# Patient Record
Sex: Male | Born: 1950
Health system: Southern US, Community
[De-identification: ages and names within clinical notes are randomized; demographics above are authoritative.]

## PROBLEM LIST (undated history)

## (undated) DIAGNOSIS — J449 Chronic obstructive pulmonary disease, unspecified: Secondary | ICD-10-CM

## (undated) DIAGNOSIS — K219 Gastro-esophageal reflux disease without esophagitis: Secondary | ICD-10-CM

## (undated) DIAGNOSIS — N200 Calculus of kidney: Secondary | ICD-10-CM

## (undated) HISTORY — DX: Chronic obstructive pulmonary disease, unspecified: J44.9

## (undated) HISTORY — DX: Gastro-esophageal reflux disease without esophagitis: K21.9

## (undated) HISTORY — PX: HAND SURGERY: SHX662

## (undated) HISTORY — DX: Calculus of kidney: N20.0

---

## 1995-09-10 HISTORY — PX: HAND SURGERY: SHX662

## 2007-06-19 ENCOUNTER — Emergency Department: Payer: Self-pay | Admitting: Emergency Medicine

## 2007-07-01 ENCOUNTER — Ambulatory Visit: Payer: Self-pay | Admitting: Urology

## 2015-03-20 ENCOUNTER — Ambulatory Visit: Payer: Self-pay | Admitting: Primary Care

## 2015-09-05 ENCOUNTER — Ambulatory Visit (INDEPENDENT_AMBULATORY_CARE_PROVIDER_SITE_OTHER): Payer: Medicare HMO | Admitting: Family Medicine

## 2015-09-05 ENCOUNTER — Encounter: Payer: Self-pay | Admitting: Family Medicine

## 2015-09-05 ENCOUNTER — Encounter (INDEPENDENT_AMBULATORY_CARE_PROVIDER_SITE_OTHER): Payer: Self-pay

## 2015-09-05 VITALS — BP 122/70 | HR 50 | Temp 97.8°F | Ht 70.0 in | Wt 163.5 lb

## 2015-09-05 DIAGNOSIS — R21 Rash and other nonspecific skin eruption: Secondary | ICD-10-CM

## 2015-09-05 DIAGNOSIS — J3489 Other specified disorders of nose and nasal sinuses: Secondary | ICD-10-CM

## 2015-09-05 DIAGNOSIS — M79646 Pain in unspecified finger(s): Secondary | ICD-10-CM

## 2015-09-05 DIAGNOSIS — M25569 Pain in unspecified knee: Secondary | ICD-10-CM | POA: Diagnosis not present

## 2015-09-05 NOTE — Progress Notes (Signed)
Pre visit review using our clinic review tool, if applicable. No additional management support is needed unless otherwise documented below in the visit note.  New patient.  He has the record request form and is working on that.  I'll await old records.    Prev with neg HIV testing per patient report.    Recently seen by mult docs with itchy rash on the R flank.  Was told it could be rhus vs eczema vs shingles.  No open lesions now.  Not spreading.  Still itchy  Asking about options.  Not painful.   H/o a lot of waterskiing over the years.  H/o back back in the R lower back for ~2 years, can happen in the AM "if I don't get out of bed the right way."  Then better as the day goes on.  Not a new sx, is chronically intermittent.  No weakness.    He has occ fungal changes in the R foot 4th web space, treated with otc med with transient relief.  Tends not to come back if treated about 1x/week.    Sinus pressure and "cracking and popping" noted, likely from ETD.  No fevers.  No pain o/w.  No fevers.  No ST.  Ongoing.    R medial knee pain.  No locking but can get puffy. Not red.  See above re: activity level, ie waterskiing.    He hyperextended his L 3rd and 4th fingers a few weeks ago, still with some soreness but no locking or lack of ROM.  No bruising or redness.    PMH and SH reviewed  ROS: See HPI, otherwise noncontributory.  Meds, vitals, and allergies reviewed.   nad ncat Tm wnl Nasal exam stuffy and congested.   OP wnl Neck supple, no LA rrr ctab abd soft Back not ttp in midline R>L lower back with patch of chronically irritated skin w/o ulceration.  Ext w/o edema R medial knee ttp on the joint line with crepitus on ROM, no bruising.  acl mcl lcl feel solid.   Varicose veins noted on the B feet, reassured.  No fungal changes noted on the feet.  L 3rd and 4th fingers with normal ROM and no edema/redness but does have some mild tenderness on the extensor tendon on the proximal L  3rd and 4th.

## 2015-09-05 NOTE — Patient Instructions (Signed)
Try icing your left hand and your right knee.  Try a soft knee sleeve (OTC).  That may help some.  Try claritin 10mg  a day for the itching on your back and the runny nose.  If the nasal symptoms are not better, then add on flonase 2 sprays per nostril per day.  Update me as needed.  I'll await the records.  Take care.  Glad to see you.  Schedule a physical for the spring of 2017.

## 2015-09-07 ENCOUNTER — Encounter: Payer: Self-pay | Admitting: Family Medicine

## 2015-09-07 DIAGNOSIS — J3489 Other specified disorders of nose and nasal sinuses: Secondary | ICD-10-CM | POA: Insufficient documentation

## 2015-09-07 DIAGNOSIS — R21 Rash and other nonspecific skin eruption: Secondary | ICD-10-CM | POA: Insufficient documentation

## 2015-09-07 DIAGNOSIS — M79646 Pain in unspecified finger(s): Secondary | ICD-10-CM | POA: Insufficient documentation

## 2015-09-07 DIAGNOSIS — M25569 Pain in unspecified knee: Secondary | ICD-10-CM | POA: Insufficient documentation

## 2015-09-07 NOTE — Assessment & Plan Note (Signed)
Try claritin and then flonase if needed, d/w pt.  He agrees.  Not likely to need abx at this point.  D/w pt. Okay for outpatient f/u. >30 minutes spent in face to face time with patient, >50% spent in counselling or coordination of care

## 2015-09-07 NOTE — Assessment & Plan Note (Signed)
Likely OA, d/w pt.  Try a knee sleeve and icing and update me as needed.  He agrees.

## 2015-09-07 NOTE — Assessment & Plan Note (Signed)
Could be eczema, given B sx.  Would use claritin for the itching and update me if not better.  If he can control the itching, then it may resolve on its own.  He agrees.

## 2015-09-07 NOTE — Assessment & Plan Note (Signed)
Should resolve, no sign of tendon failure.  fx not likely.  Try icing and f/u prn.  He agrees.

## 2015-11-05 ENCOUNTER — Other Ambulatory Visit: Payer: Self-pay | Admitting: Family Medicine

## 2015-11-05 DIAGNOSIS — Z131 Encounter for screening for diabetes mellitus: Secondary | ICD-10-CM

## 2015-11-05 DIAGNOSIS — Z13220 Encounter for screening for lipoid disorders: Secondary | ICD-10-CM

## 2015-11-09 ENCOUNTER — Other Ambulatory Visit: Payer: Medicare HMO

## 2015-11-14 ENCOUNTER — Encounter: Payer: Self-pay | Admitting: Family Medicine

## 2015-11-14 ENCOUNTER — Ambulatory Visit (INDEPENDENT_AMBULATORY_CARE_PROVIDER_SITE_OTHER): Payer: Medicare HMO | Admitting: Family Medicine

## 2015-11-14 ENCOUNTER — Other Ambulatory Visit: Payer: Self-pay | Admitting: Family Medicine

## 2015-11-14 VITALS — BP 110/68 | HR 48 | Temp 97.7°F | Ht 70.0 in | Wt 165.5 lb

## 2015-11-14 DIAGNOSIS — Z119 Encounter for screening for infectious and parasitic diseases, unspecified: Secondary | ICD-10-CM

## 2015-11-14 DIAGNOSIS — Z0000 Encounter for general adult medical examination without abnormal findings: Secondary | ICD-10-CM

## 2015-11-14 DIAGNOSIS — Z125 Encounter for screening for malignant neoplasm of prostate: Secondary | ICD-10-CM

## 2015-11-14 DIAGNOSIS — Z1211 Encounter for screening for malignant neoplasm of colon: Secondary | ICD-10-CM

## 2015-11-14 DIAGNOSIS — Z13220 Encounter for screening for lipoid disorders: Secondary | ICD-10-CM | POA: Diagnosis not present

## 2015-11-14 DIAGNOSIS — Z131 Encounter for screening for diabetes mellitus: Secondary | ICD-10-CM

## 2015-11-14 LAB — LIPID PANEL
CHOL/HDL RATIO: 4
Cholesterol: 193 mg/dL (ref 0–200)
HDL: 49.2 mg/dL (ref >39.00)
LDL CALC: 125 mg/dL — AB (ref 0–99)
NONHDL: 143.44
TRIGLYCERIDES: 90 mg/dL (ref 0.0–149.0)
VLDL: 18 mg/dL (ref 0.0–40.0)

## 2015-11-14 LAB — GLUCOSE, RANDOM: Glucose, Bld: 94 mg/dL (ref 70–99)

## 2015-11-14 LAB — PSA, MEDICARE: PSA: 0.58 ng/mL (ref 0.10–4.00)

## 2015-11-14 MED ORDER — TRIAMCINOLONE ACETONIDE 0.1 % EX CREA: 1.0000 "application " | TOPICAL_CREAM | Freq: Two times a day (BID) | CUTANEOUS | Status: DC | PRN

## 2015-11-14 MED ORDER — DOXYCYCLINE HYCLATE 100 MG PO TABS: 100.0000 mg | ORAL_TABLET | Freq: Two times a day (BID) | ORAL | Status: DC

## 2015-11-14 NOTE — Patient Instructions (Addendum)
Go to the lab on the way out.  We'll contact you with your lab report (sugar, cholesterol, prostate test, hepatitis C test, stool cards).  Check with your insurance to see if they will cover the shingles and tetanus shots. If may be cheaper at the pharmacy.   I would get a flu shot each fall.   We can do your pneumonia shot later on.   Start the antibiotics today and use the cream on the dry chapped skin.  Take care.  Glad to see you.

## 2015-11-14 NOTE — Progress Notes (Signed)
Pre visit review using our clinic review tool, if applicable. No additional management support is needed unless otherwise documented below in the visit note.  I have personally reviewed the Medicare Annual Wellness questionnaire and have noted 1. The patient's medical and social history 2. Their use of alcohol, tobacco or illicit drugs 3. Their current medications and supplements 4. The patient's functional ability including ADL's, fall risks, home safety risks and hearing or visual             impairment. 5. Diet and physical activities 6. Evidence for depression or mood disorders  The patients weight, height, BMI have been recorded in the chart and visual acuity is per eye clinic.  I have made referrals, counseling and provided education to the patient based review of the above and I have provided the pt with a written personalized care plan for preventive services.  Provider list updated- see scanned forms.  Routine anticipatory guidance given to patient.  See health maintenance.  Flu encouraged for fall 2017 Shingles d/w pt  PNA deferred for now, as patient is going to be on abx.  See below  Tetanus d/w pt.  D/w patient ZO:XWRUEAVre:options for colon cancer screening, including IFOB vs. colonoscopy.  Risks and benefits of both were discussed and patient voiced understanding.  Pt elects WUJ:WJXBfor:IFOB.  Prostate cancer screening pending. D/w pt.  Advance directive- son Marcial Pacasimothy designated if patient were incapacitated.   Cognitive function addressed- see scanned forms- and if abnormal then additional documentation follows.  Pt opts in for HCV screening.  D/w pt re: routine screening.    Incidentally noted some LUTS with urgency.  Some slowing of stream and nocturia.  Not bothersome to the point of wanting treatment.   PMH and SH reviewed  Meds, vitals, and allergies reviewed.   ROS: See HPI.  Otherwise negative.    GEN: nad, alert and oriented HEENT: mucous membranes moist NECK: supple w/o  LA CV: rrr. PULM: ctab, no inc wob ABD: soft, +bs EXT: no edema SKIN: no acute rash but dry cracked skin on the B hands with irritated and superficially infected skin on the R 2nd finger, near the PIP.  No fluctuance.

## 2015-11-15 LAB — HEPATITIS C ANTIBODY: HCV Ab: NEGATIVE

## 2015-11-16 DIAGNOSIS — Z0000 Encounter for general adult medical examination without abnormal findings: Secondary | ICD-10-CM | POA: Insufficient documentation

## 2015-11-16 NOTE — Assessment & Plan Note (Signed)
Flu encouraged for fall 2017  Shingles d/w pt  PNA deferred for now, as patient is going to be on abx. See below  Tetanus d/w pt.  D/w patient WU:JWJXBJYre:options for colon cancer screening, including IFOB vs. colonoscopy. Risks and benefits of both were discussed and patient voiced understanding. Pt elects NWG:NFAOfor:IFOB.  Prostate cancer screening pending. D/w pt.  Advance directive- son Marcial Pacasimothy designated if patient were incapacitated.  Cognitive function addressed- see scanned forms- and if abnormal then additional documentation follows.  Pt opts in for HCV screening. D/w pt re: routine screening.  EKG w/o acute changes.  sinus bradycardia. I increased the gain on the EKG. He doesn't have Q wave in V1. Other than sinus brady, wnl. Likely from high fitness level. D/w pt.  No sx. No w/u at this point. Start doxy given the skin changes on the R 2nd finger and use TAC cream on the dry skin.  Still okay for outpatient f/u. Still with normal ROM on the R2nd digit- the issue he has appears to be superficial.  Can update me as needed.

## 2015-11-30 ENCOUNTER — Other Ambulatory Visit (INDEPENDENT_AMBULATORY_CARE_PROVIDER_SITE_OTHER): Payer: Medicare HMO

## 2015-11-30 DIAGNOSIS — Z1211 Encounter for screening for malignant neoplasm of colon: Secondary | ICD-10-CM | POA: Diagnosis not present

## 2015-11-30 LAB — FECAL OCCULT BLOOD, IMMUNOCHEMICAL: FECAL OCCULT BLD: NEGATIVE

## 2015-12-01 ENCOUNTER — Encounter: Payer: Self-pay | Admitting: *Deleted

## 2016-02-10 ENCOUNTER — Ambulatory Visit: Payer: Medicare HMO | Admitting: Family Medicine

## 2016-02-12 ENCOUNTER — Ambulatory Visit: Payer: Medicare HMO | Admitting: Family Medicine

## 2016-05-16 ENCOUNTER — Ambulatory Visit (INDEPENDENT_AMBULATORY_CARE_PROVIDER_SITE_OTHER): Payer: Medicare HMO | Admitting: Family Medicine

## 2016-05-16 ENCOUNTER — Ambulatory Visit (INDEPENDENT_AMBULATORY_CARE_PROVIDER_SITE_OTHER)
Admission: RE | Admit: 2016-05-16 | Discharge: 2016-05-16 | Disposition: A | Discharge status: Home / Self Care | Payer: Medicare HMO | Source: Ambulatory Visit | Attending: Family Medicine | Admitting: Family Medicine

## 2016-05-16 ENCOUNTER — Encounter: Payer: Self-pay | Admitting: Family Medicine

## 2016-05-16 VITALS — BP 102/70 | HR 66 | Temp 97.7°F | Wt 163.5 lb

## 2016-05-16 DIAGNOSIS — R0602 Shortness of breath: Secondary | ICD-10-CM

## 2016-05-16 DIAGNOSIS — M545 Low back pain: Secondary | ICD-10-CM | POA: Diagnosis not present

## 2016-05-16 MED ORDER — CYCLOBENZAPRINE HCL 10 MG PO TABS: 5.0000 mg | ORAL_TABLET | Freq: Three times a day (TID) | ORAL | 0 refills | Status: DC | PRN

## 2016-05-16 NOTE — Progress Notes (Signed)
Pre visit review using our clinic review tool, if applicable. No additional management support is needed unless otherwise documented below in the visit note. 

## 2016-05-16 NOTE — Progress Notes (Signed)
Back pain.  Started years ago, old injury per patient report, fell about 5 years ago and then had trouble.  "I've never done anything about it."  No pain now but has episodic flares.  Last flare was last week.  Heavy lifting at baseline, some changes after sleeping awkwardly last week.  When has pain, it's in the R lower side.  It will usually get better in a few days.  No blood in urine.  No fevers.  No L sided pain.  Not abdominal pain.  No vomiting.  No blood in stool.  No weakness in his legs.  When he has the pain it can go down the R leg, down the quad.  When it flares up, he'll usually just put up with it, occ take ibuprofen or tylenol.    He has been gradually getting SOB eith exertion, gradually worse in the last year.  No wheeze.  No CP.  Former smoker.  No smoking in ~6 years.  He has some "breakthrough" relief with continued exertion.   Meds, vitals, and allergies reviewed.   ROS: Per HPI unless specifically indicated in ROS section   GEN: nad, alert and oriented HEENT: mucous membranes moist NECK: supple w/o LA CV: rrr.  PULM: ctab, no inc wob ABD: soft, +bs EXT: no edema SKIN: no acute rash Back not ttp at time of exam.    EKG with no sig change from prev.

## 2016-05-16 NOTE — Patient Instructions (Signed)
Flexeril as needed for back pain, use the back exercises.  Limit exertion for now.  Let me check your labs and xray and we'll go from there.  Take care.  Glad to see you.

## 2016-05-17 DIAGNOSIS — M549 Dorsalgia, unspecified: Secondary | ICD-10-CM | POA: Insufficient documentation

## 2016-05-17 DIAGNOSIS — R0602 Shortness of breath: Secondary | ICD-10-CM | POA: Insufficient documentation

## 2016-05-17 LAB — CBC WITH DIFFERENTIAL/PLATELET
Basophils Absolute: 0 10*3/uL (ref 0.0–0.1)
Basophils Relative: 0.6 % (ref 0.0–3.0)
EOS PCT: 1.1 % (ref 0.0–5.0)
Eosinophils Absolute: 0.1 10*3/uL (ref 0.0–0.7)
HCT: 38.3 % — ABNORMAL LOW (ref 39.0–52.0)
Hemoglobin: 13.1 g/dL (ref 13.0–17.0)
LYMPHS ABS: 1.6 10*3/uL (ref 0.7–4.0)
Lymphocytes Relative: 21.4 % (ref 12.0–46.0)
MCHC: 34.4 g/dL (ref 30.0–36.0)
MCV: 88.4 fl (ref 78.0–100.0)
MONO ABS: 0.6 10*3/uL (ref 0.1–1.0)
Monocytes Relative: 7.4 % (ref 3.0–12.0)
NEUTROS PCT: 69.5 % (ref 43.0–77.0)
Neutro Abs: 5.3 10*3/uL (ref 1.4–7.7)
PLATELETS: 215 10*3/uL (ref 150.0–400.0)
RBC: 4.33 Mil/uL (ref 4.22–5.81)
RDW: 13.4 % (ref 11.5–15.5)
WBC: 7.6 10*3/uL (ref 4.0–10.5)

## 2016-05-17 LAB — COMPREHENSIVE METABOLIC PANEL
ALK PHOS: 60 U/L (ref 39–117)
ALT: 11 U/L (ref 0–53)
AST: 15 U/L (ref 0–37)
Albumin: 4 g/dL (ref 3.5–5.2)
BUN: 23 mg/dL (ref 6–23)
CO2: 30 mEq/L (ref 19–32)
Calcium: 8.8 mg/dL (ref 8.4–10.5)
Chloride: 108 mEq/L (ref 96–112)
Creatinine, Ser: 0.94 mg/dL (ref 0.40–1.50)
GFR: 85.3 mL/min (ref >60.00)
GLUCOSE: 113 mg/dL — AB (ref 70–99)
POTASSIUM: 4.1 meq/L (ref 3.5–5.1)
SODIUM: 140 meq/L (ref 135–145)
TOTAL PROTEIN: 6.6 g/dL (ref 6.0–8.3)
Total Bilirubin: 0.4 mg/dL (ref 0.2–1.2)

## 2016-05-17 LAB — TSH: TSH: 1.89 u[IU]/mL (ref 0.35–4.50)

## 2016-05-17 NOTE — Assessment & Plan Note (Signed)
He has shortness of breath with exertion, sometimes with breakthrough symptoms. He does not wheeze, he has no sputum and he has no chest pain. EKG without significant change from previous. Chest x-ray is noted to have bullous changes. We should get spirometry done on him at some point, but in the meantime I think he should see cardiology first. I did not put him on a short acting beta agonist since is not wheezing. I would like cardiology input for consideration of treadmill testing. We can address pulmonary issues after seeing cardiology. Referral placed. App help of all involved.

## 2016-05-17 NOTE — Assessment & Plan Note (Signed)
Likely an intermittent back strain. Can use Flexeril when necessary. Discussed with patient about routine back exercises. Sedation caution on medication. Update me as needed.

## 2016-05-21 ENCOUNTER — Ambulatory Visit (INDEPENDENT_AMBULATORY_CARE_PROVIDER_SITE_OTHER): Payer: Medicare HMO | Admitting: Internal Medicine

## 2016-05-21 ENCOUNTER — Encounter: Payer: Self-pay | Admitting: Internal Medicine

## 2016-05-21 VITALS — BP 110/74 | HR 60 | Ht 70.0 in | Wt 164.0 lb

## 2016-05-21 DIAGNOSIS — R0602 Shortness of breath: Secondary | ICD-10-CM

## 2016-05-21 DIAGNOSIS — N200 Calculus of kidney: Secondary | ICD-10-CM | POA: Diagnosis not present

## 2016-05-21 NOTE — Progress Notes (Signed)
New Outpatient Visit Date: 05/21/2016  Referring Provider: Joaquim Nam, MD [address] [place], [place] [address]  Chief Complaint: Dyspnea on exertion  HPI:  [name] is a 65 y.o. year-old male with history of GERD and renal stones, who has been referred by Dr. Para March for evaluation of exertional dyspnea that has progressed over the last year. 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 He is able to carry out his usual activities that are very strenuous without any limitations. He denies chest pain but has some "anxiety" when he experiences the exertional dyspnea. The symptoms typically last for a few minutes and then resolve with rest. He denies palpitations, lightheadedness, edema, PND, and orthopnea. He does report bilateral lower extremity varicose veins that he attributes to barefoot waterskiing.  The patient denies a history of prior heart disease. He has not undergone cardiovascular evaluation the past, though he notes a urologist told him that he had "hardening of the arteries" based on a renal colic CT several years ago. He reports having had screening PFTs approximately 2 years ago that were notable for "mild COPD." He has a history of remote tobacco use and smokes marijuana on a daily basis. His family history is notable for a brother with multiple stents beginning around his mid 92s.  --------------------------------------------------------------------------------------------------  Past Medical History:  Diagnosis Date  . GERD (gastroesophageal reflux disease)   . Renal stones     No past surgical history on file.  Outpatient Encounter Prescriptions as of 05/21/2016  Medication Sig  . acetaminophen (TYLENOL) 325 MG tablet Take 325 mg by mouth every 6 (six) hours as needed.  . cyclobenzaprine (FLEXERIL) 10 MG tablet Take 0.5-1  tablets (5-10 mg total) by mouth 3 (three) times daily as needed for muscle spasms (sedation caution).  Marland Kitchen ibuprofen (ADVIL,MOTRIN) 200 MG tablet Take 200 mg by mouth every 6 (six) hours as needed (pain).  . Multiple Vitamin (MULTIVITAMIN) tablet Take 1 tablet by mouth daily.  Marland Kitchen triamcinolone cream (KENALOG) 0.1 % Apply 1 application topically 2 (two) times daily as needed. Used on chapped skin (Patient not taking: Reported on 05/21/2016)   No facility-administered encounter medications on file as of 05/21/2016.     Allergies: Prednisone and Penicillins  Social History   Social History  . Marital status: Married    Spouse name: N/A  . Number of children: N/A  . Years of education: N/A   Occupational History  . Not on file.   Social History Main Topics  . Smoking status: Former Smoker    Packs/day: 1.00    Years: 40.00    Quit date: 09/09/2008  . Smokeless tobacco: Never Used  . Alcohol use No     Comment: Very rare  . Drug use:     Types: Marijuana  . Sexual activity: Not on file   Other Topics Concern  . Not on file   Social History Narrative   Likes to [employer]- keyboard, guitar, drums   Self employed concrete work/excavations   Raises beef cattle   2 kids, 2 step kids.     Divorced.  Lives alone   From [place]    Family History  Problem Relation Age of Onset  . COPD Mother   . COPD Father   . Prostate cancer Paternal Uncle   . Coronary artery disease  Brother [id number]    multiple stents  . Colon cancer Neg Hx     Review of Systems: Mild left lower quadrant/groin pain that comes and goes.  Similar to prior kidney stone. Started 2-3 days ago and has resolved.  No hematuria or other bleeding.  Otherwise, a 12-system review of systems was performed and was negative except as noted in the HPI.  --------------------------------------------------------------------------------------------------  Physical Exam: BP 110/74   Pulse 60   Ht 5\' 10"  (1.778 m)   Wt 164  lb (74.4 kg)   BMI 23.53 kg/m   General:  Well-developed, well nourished male, seated comfortably in the exam room. HEENT: No conjunctival pallor or scleral icterus.  Moist mucous membranes.  OP clear. Neck: Supple without lymphadenopathy, thyromegaly, JVD, or HJR.  No carotid bruit. Lungs: Normal work of breathing.  Clear to auscultation bilaterally without wheezes or crackles. CV: Distant heart sounds. Regular rate and rhythm without murmurs, rubs, or gallops.  Non-displaced PMI. Abd: Bowel sounds present.  Soft, NT/ND without hepatosplenomegaly Ext: No lower extremity edema.  Radial, PT, and DP pulses are 2+ bilaterally. Varicose veins noted bilaterally in the calves. Skin: warm and dry without rash Neuro: CNIII-XII intact.  Strength and fine-touch sensation intact in upper and lower extremities bilaterally. Psych: Normal mood and affect.  EKG:  Sinus bradycardia with low voltage.  Lab Results  Component Value Date   WBC 7.6 05/16/2016   HGB 13.1 05/16/2016   HCT 38.3 (L) 05/16/2016   MCV 88.4 05/16/2016   PLT 215.0 05/16/2016    Lab Results  Component Value Date   NA 140 05/16/2016   K 4.1 05/16/2016   CL 108 05/16/2016   CO2 30 05/16/2016   BUN 23 05/16/2016   CREATININE 0.94 05/16/2016   GLUCOSE 113 (H) 05/16/2016   ALT 11 05/16/2016    Lab Results  Component Value Date   CHOL 193 11/14/2015   HDL 49.20 11/14/2015   LDLCALC 125 (H) 11/14/2015   TRIG 90.0 11/14/2015   CHOLHDL 4 11/14/2015   Lab Results  Component Value Date   TSH 1.89 05/16/2016    --------------------------------------------------------------------------------------------------  ASSESSMENT AND PLAN: 1. SOB (shortness of breath) Patient endorses shortness of breath with vigorous exertion over the last 6-12 months. This has not limited his normal activities. I suspect the most likely cause is COPD in the setting of continued marijuana use and long prior history of tobacco smoking. His exam  today is unremarkable. EKG notable only for low voltage and sinus bradycardia. TSH was within normal limits last week. Patient's risk factors for coronary artery disease include male gender, age, tobacco/marijuana use, and positive family history (albeit not premature CAD). We have agreed to perform a transthoracic echocardiogram and exercise treadmill stress test for further evaluation. Based on these results, we will discuss the need for further evaluation and medical therapy, such as addition of a statin and aspirin. I have encouraged the patient to stop smoking marijuana.  2. Kidney stone Patient experienced intermittent pain in the left lower quadrant/groin area similar to what he had in the past with a kidney stone over the last several days. This has been intermittent and no longer present for the last day or two.  If the symptoms return, the patient should speak with his PCP for further evaluation.  Follow-up: Return to clinic in 4-6 weeks to reassess symptoms and discussed results of echo and treadmill stress test.   Yvonne Kendallhristopher Theo Krumholz, MD 05/21/2016 4:43 PM

## 2016-05-21 NOTE — Patient Instructions (Addendum)
Medication Instructions:   Your physician recommends that you continue on your current medications as directed. Please refer to the Current Medication list given to you today.   If you need a refill on your cardiac medications before your next appointment, please call your pharmacy.  Labwork: NONE ORDER TODAY   Testing/Procedures: REQUESTED FOR BOTH PROCEDURE ON 06/07/2016   2PM.. Your physician has requested that you have an exercise tolerance test. For further information please visit [email or web address]. Please also follow instruction sheet, as given.   3PM..Your physician has requested that you have an echocardiogram. Echocardiography is a painless test that uses sound waves to create images of your heart. It provides your doctor with information about the size and shape of your heart and how well your heart's chambers and valves are working. This procedure takes approximately one hour. There are no restrictions for this procedure.    Follow-Up: 6 WEEKS WITH DR Cristal DeerHRISTOPHER END  Any Other Special Instructions Will Be Listed Below (If Applicable).

## 2016-05-23 NOTE — Addendum Note (Signed)
Addended by: Festus AloeRESPO, Tamir Wallman G on: 05/23/2016 12:58 PM   Modules accepted: Orders

## 2016-06-07 ENCOUNTER — Other Ambulatory Visit: Payer: Self-pay

## 2016-06-07 ENCOUNTER — Ambulatory Visit (INDEPENDENT_AMBULATORY_CARE_PROVIDER_SITE_OTHER): Payer: Medicare HMO

## 2016-06-07 DIAGNOSIS — R0602 Shortness of breath: Secondary | ICD-10-CM

## 2016-06-07 LAB — EXERCISE TOLERANCE TEST
CSEPED: 7 min
CSEPEDS: 42 s
CSEPHR: 86 %
Estimated workload: 9.6 METS
MPHR: 154 {beats}/min
Peak HR: 133 {beats}/min
Rest HR: 59 {beats}/min

## 2016-08-27 ENCOUNTER — Ambulatory Visit: Payer: Medicare HMO | Admitting: Internal Medicine

## 2017-09-18 ENCOUNTER — Encounter: Payer: Medicare HMO | Admitting: Family Medicine

## 2017-10-28 ENCOUNTER — Encounter: Payer: Self-pay | Admitting: Family Medicine

## 2017-11-17 ENCOUNTER — Ambulatory Visit (INDEPENDENT_AMBULATORY_CARE_PROVIDER_SITE_OTHER): Payer: Medicare PPO | Admitting: Family Medicine

## 2017-11-17 ENCOUNTER — Encounter: Payer: Self-pay | Admitting: Family Medicine

## 2017-11-17 VITALS — BP 106/66 | HR 50 | Temp 97.6°F | Ht 70.0 in | Wt 162.8 lb

## 2017-11-17 DIAGNOSIS — Z0000 Encounter for general adult medical examination without abnormal findings: Secondary | ICD-10-CM

## 2017-11-17 DIAGNOSIS — Z7189 Other specified counseling: Secondary | ICD-10-CM

## 2017-11-17 DIAGNOSIS — E785 Hyperlipidemia, unspecified: Secondary | ICD-10-CM | POA: Diagnosis not present

## 2017-11-17 DIAGNOSIS — Z125 Encounter for screening for malignant neoplasm of prostate: Secondary | ICD-10-CM | POA: Diagnosis not present

## 2017-11-17 DIAGNOSIS — R21 Rash and other nonspecific skin eruption: Secondary | ICD-10-CM

## 2017-11-17 DIAGNOSIS — Z23 Encounter for immunization: Secondary | ICD-10-CM

## 2017-11-17 MED ORDER — TRIAMCINOLONE ACETONIDE 0.1 % EX CREA: 1.0000 "application " | TOPICAL_CREAM | Freq: Two times a day (BID) | CUTANEOUS | 1 refills | Status: DC | PRN

## 2017-11-17 NOTE — Progress Notes (Signed)
I have personally reviewed the Medicare Annual Wellness questionnaire and have noted 1. The patient's medical and social history 2. Their use of alcohol, tobacco or illicit drugs 3. Their current medications and supplements 4. The patient's functional ability including ADL's, fall risks, home safety risks and hearing or visual             impairment. 5. Diet and physical activities 6. Evidence for depression or mood disorders  The patients weight, height, BMI have been recorded in the chart and visual acuity is per eye clinic.  I have made referrals, counseling and provided education to the patient based review of the above and I have provided the pt with a written personalized care plan for preventive services.  Provider list updated- see scanned forms.  Routine anticipatory guidance given to patient.  See health maintenance. The possibility exists that previously documented standard health maintenance information may have been brought forward from a previous encounter into this note.  If needed, that same information has been updated to reflect the current situation based on today's encounter.    Flu done 10/2017  Shingles d/w pt  PNA 2019 Tetanus d/w pt.  D/w patient ZO:XWRUEAVre:options for colon cancer screening, including IFOB vs. colonoscopy. Risks and benefits of both were discussed and patient voiced understanding. Pt elects for: cologuard.  Order placed.   Prostate cancer screening pending. D/w pt.  Advance directive- son Marcial Pacasimothy designated if patient were incapacitated.  Cognitive function addressed- see scanned forms- and if abnormal then additional documentation follows.  HCV screen prev neg.    History of mild hyperlipidemia noted and due for recheck labs.  He had some recent cramping in his legs, at night.  No exertional symptoms.  We talked about adequate fluid intake.  He has routine labs pending.  D/w pt about possible chocolate allergy with skin reaction.  The rash on his hands  seem to be worse after eating more chocolate.  He does have history of baseline skin irritation that has responded to triamcinolone in the past and he needed a refill.  PMH and SH reviewed  Meds, vitals, and allergies reviewed.   ROS: Per HPI.  Unless specifically indicated otherwise in HPI, the patient denies:  General: fever. Eyes: acute vision changes ENT: sore throat Cardiovascular: chest pain Respiratory: SOB GI: vomiting GU: dysuria Musculoskeletal: acute back pain Derm: acute rash Neuro: acute motor dysfunction Psych: worsening mood Endocrine: polydipsia Heme: bleeding Allergy: hayfever  GEN: nad, alert and oriented HEENT: mucous membranes moist NECK: supple w/o LA CV: rrr. PULM: ctab, no inc wob ABD: soft, +bs EXT: no edema SKIN: Bilateral hands with skin irritation noted.  No ulceration.  He did have a healing injury on the dorsum of the left hand where he had scraped it about a week ago.  This appears to be healing normally.

## 2017-11-17 NOTE — Patient Instructions (Addendum)
Check with your insurance to see if they will cover the tetanus shot. It may be cheaper at the pharmacy.   We'll get the cologuard test set up.   Go to the lab on the way out.  We'll contact you with your lab report. Take care.  Glad to see you.

## 2017-11-18 DIAGNOSIS — Z7189 Other specified counseling: Secondary | ICD-10-CM | POA: Insufficient documentation

## 2017-11-18 LAB — COMPREHENSIVE METABOLIC PANEL
ALT: 11 U/L (ref 0–53)
AST: 13 U/L (ref 0–37)
Albumin: 4.3 g/dL (ref 3.5–5.2)
Alkaline Phosphatase: 57 U/L (ref 39–117)
BUN: 22 mg/dL (ref 6–23)
CO2: 32 mEq/L (ref 19–32)
Calcium: 10 mg/dL (ref 8.4–10.5)
Chloride: 102 mEq/L (ref 96–112)
Creatinine, Ser: 1.04 mg/dL (ref 0.40–1.50)
GFR: 75.56 mL/min (ref >60.00)
GLUCOSE: 92 mg/dL (ref 70–99)
POTASSIUM: 4.9 meq/L (ref 3.5–5.1)
Sodium: 140 mEq/L (ref 135–145)
Total Bilirubin: 0.4 mg/dL (ref 0.2–1.2)
Total Protein: 6.9 g/dL (ref 6.0–8.3)

## 2017-11-18 LAB — LIPID PANEL
Cholesterol: 192 mg/dL (ref 0–200)
HDL: 46.8 mg/dL (ref >39.00)
LDL CALC: 109 mg/dL — AB (ref 0–99)
NONHDL: 145.08
Total CHOL/HDL Ratio: 4
Triglycerides: 182 mg/dL — ABNORMAL HIGH (ref 0.0–149.0)
VLDL: 36.4 mg/dL (ref 0.0–40.0)

## 2017-11-18 LAB — PSA, MEDICARE: PSA: 0.48 ng/mL (ref 0.10–4.00)

## 2017-11-18 NOTE — Assessment & Plan Note (Signed)
Advance directive- son Timothy designated if patient were incapacitated.  

## 2017-11-18 NOTE — Assessment & Plan Note (Signed)
Skin irritation related to chocolate intake may be a separate issue.  We talked about trigger avoidance.  He has used triamcinolone for skin irritation in the past and this is reasonable to use as needed.  Routine cautions given.  He agrees.

## 2017-11-18 NOTE — Assessment & Plan Note (Addendum)
Flu done 10/2017  Shingles d/w pt  PNA 2019 Tetanus d/w pt.  D/w patient WU:JWJXBJYre:options for colon cancer screening, including IFOB vs. colonoscopy. Risks and benefits of both were discussed and patient voiced understanding. Pt elects for: cologuard.  Order placed.   Prostate cancer screening pending. D/w pt.  Advance directive- son Marcial Pacasimothy designated if patient were incapacitated.  Cognitive function addressed- see scanned forms- and if abnormal then additional documentation follows.  HCV screen prev neg.   History of mild hyperlipidemia noted and due for recheck labs.

## 2017-11-25 DIAGNOSIS — Z1212 Encounter for screening for malignant neoplasm of rectum: Secondary | ICD-10-CM | POA: Diagnosis not present

## 2017-11-25 DIAGNOSIS — Z1211 Encounter for screening for malignant neoplasm of colon: Secondary | ICD-10-CM | POA: Diagnosis not present

## 2017-11-25 LAB — COLOGUARD: Cologuard: NEGATIVE

## 2017-12-04 ENCOUNTER — Encounter: Payer: Self-pay | Admitting: Family Medicine

## 2018-01-02 ENCOUNTER — Emergency Department
Admission: EM | Admit: 2018-01-02 | Discharge: 2018-01-02 | Disposition: A | Discharge status: Home / Self Care | Payer: Medicare PPO | Attending: Emergency Medicine | Admitting: Emergency Medicine

## 2018-01-02 ENCOUNTER — Emergency Department: Payer: Medicare PPO

## 2018-01-02 ENCOUNTER — Other Ambulatory Visit: Payer: Self-pay

## 2018-01-02 DIAGNOSIS — Z87891 Personal history of nicotine dependence: Secondary | ICD-10-CM | POA: Insufficient documentation

## 2018-01-02 DIAGNOSIS — R05 Cough: Secondary | ICD-10-CM | POA: Diagnosis not present

## 2018-01-02 DIAGNOSIS — J441 Chronic obstructive pulmonary disease with (acute) exacerbation: Secondary | ICD-10-CM | POA: Diagnosis not present

## 2018-01-02 DIAGNOSIS — Z79899 Other long term (current) drug therapy: Secondary | ICD-10-CM | POA: Diagnosis not present

## 2018-01-02 DIAGNOSIS — S30861A Insect bite (nonvenomous) of abdominal wall, initial encounter: Secondary | ICD-10-CM | POA: Diagnosis not present

## 2018-01-02 DIAGNOSIS — W57XXXA Bitten or stung by nonvenomous insect and other nonvenomous arthropods, initial encounter: Secondary | ICD-10-CM | POA: Insufficient documentation

## 2018-01-02 DIAGNOSIS — R0602 Shortness of breath: Secondary | ICD-10-CM | POA: Diagnosis present

## 2018-01-02 DIAGNOSIS — R059 Cough, unspecified: Secondary | ICD-10-CM

## 2018-01-02 LAB — CBC
HEMATOCRIT: 43 % (ref 40.0–52.0)
HEMOGLOBIN: 15.1 g/dL (ref 13.0–18.0)
MCH: 29.7 pg (ref 26.0–34.0)
MCHC: 35 g/dL (ref 32.0–36.0)
MCV: 84.9 fL (ref 80.0–100.0)
Platelets: 173 10*3/uL (ref 150–440)
RBC: 5.07 MIL/uL (ref 4.40–5.90)
RDW: 13.6 % (ref 11.5–14.5)
WBC: 8.8 10*3/uL (ref 3.8–10.6)

## 2018-01-02 LAB — TROPONIN I: Troponin I: <0.03 ng/mL (ref <0.03)

## 2018-01-02 LAB — BASIC METABOLIC PANEL
ANION GAP: 9 (ref 5–15)
BUN: 18 mg/dL (ref 6–20)
CALCIUM: 9.3 mg/dL (ref 8.9–10.3)
CO2: 29 mmol/L (ref 22–32)
Chloride: 95 mmol/L — ABNORMAL LOW (ref 101–111)
Creatinine, Ser: 0.8 mg/dL (ref 0.61–1.24)
GFR calc Af Amer: >60 mL/min (ref >60)
GFR calc non Af Amer: >60 mL/min (ref >60)
GLUCOSE: 110 mg/dL — AB (ref 65–99)
Potassium: 4.4 mmol/L (ref 3.5–5.1)
Sodium: 133 mmol/L — ABNORMAL LOW (ref 135–145)

## 2018-01-02 MED ORDER — ONDANSETRON 4 MG PO TBDP: 4.0000 mg | ORAL_TABLET | Freq: Three times a day (TID) | ORAL | 0 refills | Status: DC | PRN

## 2018-01-02 MED ORDER — ACETAMINOPHEN 325 MG PO TABS
ORAL_TABLET | ORAL | Status: AC
  Administered 2018-01-02: 650 mg via ORAL
  Filled 2018-01-02: qty 2

## 2018-01-02 MED ORDER — DOXYCYCLINE HYCLATE 100 MG PO TABS
100.0000 mg | ORAL_TABLET | Freq: Once | ORAL | Status: AC
  Administered 2018-01-02: 100 mg via ORAL
  Filled 2018-01-02: qty 1

## 2018-01-02 MED ORDER — HYDROCOD POLST-CPM POLST ER 10-8 MG/5ML PO SUER
2.5000 mL | Freq: Once | ORAL | Status: AC
  Administered 2018-01-02: 2.5 mL via ORAL
  Filled 2018-01-02: qty 5

## 2018-01-02 MED ORDER — IBUPROFEN 800 MG PO TABS
800.0000 mg | ORAL_TABLET | Freq: Once | ORAL | Status: AC | PRN
  Administered 2018-01-02: 800 mg via ORAL
  Filled 2018-01-02: qty 1

## 2018-01-02 MED ORDER — IPRATROPIUM-ALBUTEROL 0.5-2.5 (3) MG/3ML IN SOLN
3.0000 mL | Freq: Once | RESPIRATORY_TRACT | Status: AC
Start: 2018-01-02 — End: 2018-01-02
  Administered 2018-01-02: 3 mL via RESPIRATORY_TRACT
  Filled 2018-01-02: qty 3

## 2018-01-02 MED ORDER — ACETAMINOPHEN 325 MG PO TABS
650.0000 mg | ORAL_TABLET | Freq: Once | ORAL | Status: AC
  Administered 2018-01-02: 650 mg via ORAL

## 2018-01-02 MED ORDER — ONDANSETRON 4 MG PO TBDP
4.0000 mg | ORAL_TABLET | Freq: Once | ORAL | Status: AC
  Administered 2018-01-02: 4 mg via ORAL
  Filled 2018-01-02: qty 1

## 2018-01-02 MED ORDER — DOXYCYCLINE HYCLATE 50 MG PO CAPS: 100.0000 mg | ORAL_CAPSULE | Freq: Two times a day (BID) | ORAL | 0 refills | Status: DC

## 2018-01-02 MED ORDER — ALBUTEROL SULFATE (2.5 MG/3ML) 0.083% IN NEBU
5.0000 mg | INHALATION_SOLUTION | Freq: Once | RESPIRATORY_TRACT | Status: AC
  Administered 2018-01-02: 5 mg via RESPIRATORY_TRACT
  Filled 2018-01-02: qty 6

## 2018-01-02 MED ORDER — IPRATROPIUM-ALBUTEROL 0.5-2.5 (3) MG/3ML IN SOLN
3.0000 mL | Freq: Once | RESPIRATORY_TRACT | Status: AC
  Administered 2018-01-02: 3 mL via RESPIRATORY_TRACT
  Filled 2018-01-02: qty 3

## 2018-01-02 MED ORDER — ALBUTEROL SULFATE HFA 108 (90 BASE) MCG/ACT IN AERS: 2.0000 | INHALATION_SPRAY | RESPIRATORY_TRACT | 0 refills | Status: DC | PRN

## 2018-01-02 NOTE — ED Notes (Signed)
RN Vikki PortsValerie notify myself and MD Darnelle CatalanMalinda of a drop in HR to the 30s per pulse ox reading. MD gave verbal order for patient to be on cardiac monitoring. NSR at a rate of 98 at current time. PT will be monitored before discharging per MD

## 2018-01-02 NOTE — ED Notes (Signed)
Pt not found in lobby 

## 2018-01-02 NOTE — ED Provider Notes (Signed)
patient doing better now. Talking in complete sentences. He still has occasional end expiratory wheezes in his lungs worse in the bases. He is asking for something for nausea as the Doxy made him nauseated. We'll give him some Zofran ODT and then Dr. Evelene CroonSantos doxycycline and albuterol. Patient will return if he is worse or follow-up with his regular physician.   Roger Lewis, Roger Igou F, MD 01/02/18 [phone]

## 2018-01-02 NOTE — ED Notes (Signed)
Patient transported to X-ray 

## 2018-01-02 NOTE — Discharge Instructions (Signed)
1.  Take antibiotic as prescribed (Doxycycline 100 mg for 7 days). 2.  Use albuterol inhaler 2 puffs every 4 hours as needed for cough/wheezing/difficulty breathing. 3.  Return to the ER for worsening symptoms, persistent vomiting, difficulty breathing or other concerns.

## 2018-01-02 NOTE — ED Triage Notes (Signed)
Patient c/o SOB, non-productive cough, congestion, and medial chest tightness.

## 2018-01-02 NOTE — ED Provider Notes (Signed)
[facility] Emergency Department Provider Note   ____________________________________________   First MD Initiated Contact with Patient 01/02/18 [phone]     (approximate)  I have reviewed the triage vital signs and the nursing notes.   HISTORY  Chief Complaint Shortness of Breath and Cough    HPI Roger Lewis is a 67 y.o. male who presents to the ED from home with a chief complaint of dry cough, congestion, shortness of breath and chest tightness.  Patient with the above symptoms for the past 3 to 4 days.  Thinks he was diagnosed with COPD sometime ago but is not currently on an inhaler or nebulizer.  Denies associated chills, abdominal pain, nausea, vomiting.  Removed 2 ticks this week and has noted low-grade temperature 100.2 F with mild frontal headache.  Denies associated neck pain or rash.  Works outdoors as a Administrator; has seasonal allergies but has not taken any medicines because he is sensitive to medications.  Denies recent travel or trauma.   Past Medical History:  Diagnosis Date  . GERD (gastroesophageal reflux disease)   . Renal stones     Patient Active Problem List   Diagnosis Date Noted  . Advance care planning 11/18/2017  . Back pain 05/17/2016  . Medicare annual wellness visit, subsequent 11/16/2015  . Rash and nonspecific skin eruption 09/07/2015  . Knee pain 09/07/2015  . Finger pain 09/07/2015    History reviewed. No pertinent surgical history.  Prior to Admission medications   Medication Sig Start Date End Date Taking? Authorizing Provider  acetaminophen (TYLENOL) 325 MG tablet Take 325 mg by mouth every 6 (six) hours as needed.    [provider]  ibuprofen (ADVIL,MOTRIN) 200 MG tablet Take 200 mg by mouth every 6 (six) hours as needed (pain).    [provider]  Multiple Vitamin (MULTIVITAMIN) tablet Take 1 tablet by mouth daily.    [provider]  triamcinolone cream (KENALOG) 0.1 % Apply 1  application topically 2 (two) times daily as needed. Used on chapped skin 11/17/17   Joaquim Nam, MD    Allergies Chocolate; Flexeril [cyclobenzaprine]; Prednisone; and Penicillins  Family History  Problem Relation Age of Onset  . COPD Mother   . COPD Father   . Prostate cancer Paternal Uncle   . Coronary artery disease Brother 39       multiple stents  . Colon cancer Neg Hx     Social History Social History   Tobacco Use  . Smoking status: Former Smoker    Packs/day: 1.00    Years: 40.00    Pack years: 40.00    Last attempt to quit: 09/09/2008    Years since quitting: 9.3  . Smokeless tobacco: Never Used  Substance Use Topics  . Alcohol use: No    Alcohol/week: 0.0 oz    Comment: Very rare  . Drug use: Yes    Types: Marijuana    Review of Systems  Constitutional: Positive for low-grade temperature.  No chills. Eyes: No visual changes. ENT: No sore throat. Cardiovascular: Positive for chest tightness. Respiratory: Positive for dry cough and shortness of breath. Gastrointestinal: No abdominal pain.  No nausea, no vomiting.  No diarrhea.  No constipation. Genitourinary: Negative for dysuria. Musculoskeletal: Negative for back pain. Skin: Positive for tick bite.  Negative for rash. Neurological: Positive for headache.  Negative for focal weakness or numbness.   ____________________________________________   PHYSICAL EXAM:  VITAL SIGNS: ED Triage Vitals  Enc Vitals Group  BP 01/02/18 0150 133/76     Pulse Rate 01/02/18 0150 100     Resp 01/02/18 0150 (!) 21     Temp 01/02/18 0150 98.1 F (36.7 C)     Temp Source 01/02/18 0150 Oral     SpO2 01/02/18 0150 96 %     Weight 01/02/18 0151 165 lb (74.8 kg)     Height --      Head Circumference --      Peak Flow --      Pain Score 01/02/18 0150 7     Pain Loc --      Pain Edu? --      Excl. in GC? --     Constitutional: Alert and oriented. Well appearing and in no acute distress. Eyes: Conjunctivae  are normal. PERRL. EOMI. Head: Atraumatic. Nose: Congestion/rhinnorhea. Mouth/Throat: Mucous membranes are moist.  Oropharynx non-erythematous. Neck: No stridor.  Supple neck without meningismus.   Cardiovascular: Normal rate, regular rhythm. Grossly normal heart sounds.  Good peripheral circulation. Respiratory: Normal respiratory effort.  No retractions. Lungs with scattered wheezing. Gastrointestinal: Soft and nontender. No distention. No abdominal bruits. No CVA tenderness. Musculoskeletal: Examined site of tick bite on right flank area; no head remains.  There is no induration, warmth, erythema or fluctuance surrounding the site.  No lower extremity tenderness nor edema.  No joint effusions. Neurologic:  Normal speech and language. No gross focal neurologic deficits are appreciated. No gait instability. Skin:  Skin is warm, dry and intact. No rash noted.  No petechiae. Psychiatric: Mood and affect are normal. Speech and behavior are normal.  ____________________________________________   LABS (all labs ordered are listed, but only abnormal results are displayed)  Labs Reviewed  BASIC METABOLIC PANEL - Abnormal; Notable for the following components:      Result Value   Sodium 133 (*)    Chloride 95 (*)    Glucose, Bld 110 (*)    All other components within normal limits  CBC  TROPONIN I   ____________________________________________  EKG  ED ECG REPORT I, Love Chowning J, the attending physician, personally viewed and interpreted this ECG.   Date: 01/02/2018  EKG Time: 0152  Rate: 96  Rhythm: normal EKG, normal sinus rhythm  Axis: Normal  Intervals: Occasional PVCs  ST&T Change: Nonspecific  ____________________________________________  RADIOLOGY  ED MD interpretation: No pneumonia  Official radiology report(s): Dg Chest 2 View  Result Date: 01/02/2018 CLINICAL DATA:  Dyspnea with nonproductive cough, congestion and medial chest tightness. EXAM: CHEST - 2 VIEW  COMPARISON:  05/16/2016 FINDINGS: Emphysematous hyperinflation of the lungs without alveolar consolidation, effusion or pneumothorax. No pulmonary edema. Heart and mediastinal contours are stable and within normal limits. There is mild aortic atherosclerosis at the arch. No acute osseous abnormality. IMPRESSION: Redemonstration pulmonary emphysema. Aortic atherosclerosis. No acute pulmonary consolidation or CHF. Electronically Signed   By: Tollie Ethavid  Kwon M.D.   On: 01/02/2018 02:40    ____________________________________________   PROCEDURES  Procedure(s) performed: None  Procedures  Critical Care performed: No  ____________________________________________   INITIAL IMPRESSION / ASSESSMENT AND PLAN / ED COURSE  As part of my medical decision making, I reviewed the following data within the electronic MEDICAL RECORD NUMBER Nursing notes reviewed and incorporated, Labs reviewed, EKG interpreted, Old chart reviewed, Radiograph reviewed and Notes from prior ED visits   67 year old male who presents with dry cough, congestion, wheezing and chest tightness associated with breathing. Differential includes, but is not limited to, viral syndrome, bronchitis including COPD exacerbation,  pneumonia, reactive airway disease including asthma, CHF including exacerbation with or without pulmonary/interstitial edema, pneumothorax, ACS, thoracic trauma, and pulmonary embolism.   Patient had an albuterol nebulizer while awaiting treatment room and reports improvement from that.  Will administer DuoNeb improve wheezing heard on exam.  In the past patient has taken prednisone which has made him hallucinate and thrown him into a rage.  For this reason, we will avoid this.  Patient mentions he is very sensitive to medicine so we will give half dose of Tussionex for cough.  I did advise him to take the full dose of doxycycline for both empiric treatment of tick bite as well as URI/cough for which patient is requesting  antibiotics.   Laboratory and imaging results unremarkable.  Given that patient has had symptoms for the past 3 days with negative troponin; do not think a repeat troponin is necessary. Clinical Course as of Jan 02 725  Fri Jan 02, 2018  0654 Patient feeling significantly better.  Zofran was given for nausea incurred after receiving Tussionex.  Slight wheezing remains.  Will clear this up with second DuoNeb.  Anticipate patient will be able to go home upon reassessment after second DuoNeb.  Will discharge home with prescriptions for albuterol inhaler and doxycycline.  Care transferred to Dr. Darnelle Catalan  for reassessment and disposition.   [JS]    Clinical Course User Index [JS] Irean Hong, MD     ____________________________________________   FINAL CLINICAL IMPRESSION(S) / ED DIAGNOSES  Final diagnoses:  COPD exacerbation (HCC)  Cough     ED Discharge Orders    None       Note:  This document was prepared using Dragon voice recognition software and may include unintentional dictation errors.    Irean Hong, MD 01/02/18 [phone]

## 2018-01-02 NOTE — ED Provider Notes (Signed)
patient was went to be discharged and was noted that on the pulse ox is heart rate was in the 30s. Patient was placed on the heart monitor was then noted that the pulse ox was often only picking up PVCs he's having an occasional regular beats on the heart monitor his heart rate is in the 80s and 90s regularly patient is complaining of a headache give him Tylenol and/or Motrin plan on discharging him shortly   Arnaldo NatalMalinda, Margarete Horace F, MD 01/02/18 [phone]

## 2018-01-09 ENCOUNTER — Ambulatory Visit: Payer: Medicare PPO | Admitting: Family Medicine

## 2018-01-09 ENCOUNTER — Encounter: Payer: Self-pay | Admitting: Family Medicine

## 2018-01-09 VITALS — BP 110/74 | HR 69 | Temp 97.8°F | Ht 70.0 in | Wt 157.0 lb

## 2018-01-09 DIAGNOSIS — R059 Cough, unspecified: Secondary | ICD-10-CM

## 2018-01-09 DIAGNOSIS — B079 Viral wart, unspecified: Secondary | ICD-10-CM | POA: Diagnosis not present

## 2018-01-09 DIAGNOSIS — R05 Cough: Secondary | ICD-10-CM | POA: Diagnosis not present

## 2018-01-09 DIAGNOSIS — W57XXXD Bitten or stung by nonvenomous insect and other nonvenomous arthropods, subsequent encounter: Secondary | ICD-10-CM

## 2018-01-09 MED ORDER — BENZONATATE 200 MG PO CAPS: 200.0000 mg | ORAL_CAPSULE | Freq: Three times a day (TID) | ORAL | 0 refills | Status: DC | PRN

## 2018-01-09 NOTE — Patient Instructions (Signed)
The wart should gradually blister and then heal over.  Keep covered as needed.  Use tessalon with the inhaler for the cough.  Gradually try to wean off the inhaler.  If you continue to need albuterol, then let me know so we can add on another inhaler.  Take care.  Glad to see you.

## 2018-01-09 NOTE — Progress Notes (Signed)
ER f/u.  He was seen at ER with cough and after a tick bite.  He didn't have to use zofran, med list updated.  He had vertigo and dry heaving he attributed to the doxycycline, But he has been able to take the medicine in the meantime.    He is better than prev.  He as one dose of doxy left.  Some sputum but better than prev, was discolored but now clear.  No fevers now.   We talked about COPD dx and options.  Under normal circumstances, he is usually not coughing/wheezing/SOB.  It would make sense to have SABA to use if needed, if needed frequently we could escalate tx.  D/w pt.    Meds, vitals, and allergies reviewed.   ROS: Per HPI unless specifically indicated in ROS section   GEN: nad, alert and oriented HEENT: mucous membranes moist NECK: supple w/o LA CV: rrr PULM: ctab, no inc wob ABD: soft, +bs EXT: no edema SKIN: no acute rash L 3rd finger with small warty lesion noted.

## 2018-01-11 DIAGNOSIS — R059 Cough, unspecified: Secondary | ICD-10-CM | POA: Insufficient documentation

## 2018-01-11 DIAGNOSIS — W57XXXA Bitten or stung by nonvenomous insect and other nonvenomous arthropods, initial encounter: Secondary | ICD-10-CM | POA: Insufficient documentation

## 2018-01-11 DIAGNOSIS — B079 Viral wart, unspecified: Secondary | ICD-10-CM | POA: Insufficient documentation

## 2018-01-11 DIAGNOSIS — R05 Cough: Secondary | ICD-10-CM | POA: Insufficient documentation

## 2018-01-11 NOTE — Assessment & Plan Note (Signed)
He has changes consistent with COPD based on previous x-ray.  That does not equal a diagnosis based on pulmonary function testing.  It makes it is not to test him otherwise at this point, since his recent illness would likely affect his performance.  Rationale discussed with patient. We talked about COPD dx and options.  Under normal circumstances, he is usually not coughing/wheezing/SOB.  It would make sense to have SABA to use if needed, if needed frequently we could escalate tx.  D/w pt.  He agrees.  >25 minutes spent in face to face time with patient, >50% spent in counselling or coordination of care.

## 2018-01-11 NOTE — Assessment & Plan Note (Signed)
Frozen x3 with liquid nitrogen.  Routine cautions given.  Routine instructions given.  We have discussed options for treatment and he consented for liquid nitrogen treatment.  He knows that no treatment is 100% effective.

## 2018-01-11 NOTE — Assessment & Plan Note (Signed)
He was covered with doxycycline in the meantime and should not need specific follow-up.  He feels better in the meantime.  Discussed.

## 2018-02-12 ENCOUNTER — Encounter: Payer: Self-pay | Admitting: Family Medicine

## 2018-02-15 ENCOUNTER — Other Ambulatory Visit: Payer: Self-pay | Admitting: Family Medicine

## 2018-02-15 DIAGNOSIS — R21 Rash and other nonspecific skin eruption: Secondary | ICD-10-CM

## 2018-10-13 DIAGNOSIS — L308 Other specified dermatitis: Secondary | ICD-10-CM | POA: Diagnosis not present

## 2018-12-04 DIAGNOSIS — S0502XA Injury of conjunctiva and corneal abrasion without foreign body, left eye, initial encounter: Secondary | ICD-10-CM | POA: Diagnosis not present

## 2018-12-08 DIAGNOSIS — S0502XD Injury of conjunctiva and corneal abrasion without foreign body, left eye, subsequent encounter: Secondary | ICD-10-CM | POA: Diagnosis not present

## 2020-02-24 ENCOUNTER — Ambulatory Visit (INDEPENDENT_AMBULATORY_CARE_PROVIDER_SITE_OTHER)
Admission: RE | Admit: 2020-02-24 | Discharge: 2020-02-24 | Disposition: A | Discharge status: Home / Self Care | Payer: Medicare PPO | Source: Ambulatory Visit | Attending: Family Medicine | Admitting: Family Medicine

## 2020-02-24 ENCOUNTER — Ambulatory Visit: Payer: Medicare PPO | Admitting: Family Medicine

## 2020-02-24 ENCOUNTER — Other Ambulatory Visit: Payer: Self-pay

## 2020-02-24 ENCOUNTER — Encounter: Payer: Self-pay | Admitting: Family Medicine

## 2020-02-24 VITALS — BP 130/80 | HR 64 | Temp 97.0°F | Ht 70.0 in | Wt 168.0 lb

## 2020-02-24 DIAGNOSIS — Z8669 Personal history of other diseases of the nervous system and sense organs: Secondary | ICD-10-CM | POA: Diagnosis not present

## 2020-02-24 DIAGNOSIS — R0789 Other chest pain: Secondary | ICD-10-CM

## 2020-02-24 DIAGNOSIS — R21 Rash and other nonspecific skin eruption: Secondary | ICD-10-CM

## 2020-02-24 DIAGNOSIS — J439 Emphysema, unspecified: Secondary | ICD-10-CM | POA: Diagnosis not present

## 2020-02-24 MED ORDER — BENZONATATE 200 MG PO CAPS: 200.0000 mg | ORAL_CAPSULE | Freq: Three times a day (TID) | ORAL | 1 refills | Status: DC | PRN

## 2020-02-24 MED ORDER — TRAMADOL HCL 50 MG PO TABS: 50.0000 mg | ORAL_TABLET | Freq: Three times a day (TID) | ORAL | 0 refills | Status: AC | PRN

## 2020-02-24 MED ORDER — TRIAMCINOLONE ACETONIDE 0.1 % EX CREA: 1.0000 "application " | TOPICAL_CREAM | Freq: Two times a day (BID) | CUTANEOUS | 1 refills | Status: DC | PRN

## 2020-02-24 MED ORDER — ALBUTEROL SULFATE HFA 108 (90 BASE) MCG/ACT IN AERS: 1.0000 | INHALATION_SPRAY | Freq: Four times a day (QID) | RESPIRATORY_TRACT | 2 refills | Status: DC | PRN

## 2020-02-24 MED ORDER — NEOMYCIN-POLYMYXIN-HC 3.5-10000-1 OT SOLN: 3.0000 [drp] | Freq: Four times a day (QID) | OTIC | 0 refills | Status: DC

## 2020-02-24 NOTE — Patient Instructions (Signed)
Go to the lab on the way out.   If you have mychart we'll likely use that to update you.    Use the pain medicine if needed.  Sedation caution.  Update me as needed.  Take care.  Glad to see you.

## 2020-02-24 NOTE — Progress Notes (Signed)
This visit occurred during the SARS-CoV-2 public health emergency.  Safety protocols were in place, including screening questions prior to the visit, additional usage of staff PPE, and extensive cleaning of exam room while observing appropriate contact time as indicated for disinfecting solutions.  He has used cortisporin in the past for ear infections after water skiing.  We talked about having rx on hand.  No symptoms currently.  Need refill on TAC for his hand rash.  Use previously on a as needed basis with relief.  He quit smoking.    He was cutting up a lot of wood, [place]  [place] of the wood was floating in the water near shore.  He was reaching under the pier with his R hand, while laying on the top of the deck, and then had pain along ribs on the R side.  This was 3 days ago.  He felt a crack on the R side.  Minimal sputum, minimally yellowish but mostly clear.  occ wheeze.  Last used SABA months ago.  His albuterol expired.  He needed refill on that and tessalon.  No L sided pain.  Pain with deep breath along the right side of the chest wall  Covid vaccination encouraged.  Rationale discussed.  Meds, vitals, and allergies reviewed.   ROS: Per HPI unless specifically indicated in ROS section   GEN: nad, alert and oriented HEENT: TM wnl bilaterally and canals normal bilaterally. NECK: supple w/o LA CV: rrr.  PULM: ctab, no inc wob, right chest wall tender to palpation.  He also has pain locally with a deep breath but this is better with exterior compression along the ribs locally.  No bruising. ABD: soft, +bs EXT: no edema SKIN: no acute rash

## 2020-02-27 DIAGNOSIS — R0789 Other chest pain: Secondary | ICD-10-CM | POA: Insufficient documentation

## 2020-02-27 DIAGNOSIS — Z8669 Personal history of other diseases of the nervous system and sense organs: Secondary | ICD-10-CM | POA: Insufficient documentation

## 2020-02-27 NOTE — Assessment & Plan Note (Signed)
Can use triamcinolone as needed.  Update me as needed.

## 2020-02-27 NOTE — Assessment & Plan Note (Signed)
Prescription written have Cortisporin on hand.  No symptoms currently.  He will update me as needed.

## 2020-02-27 NOTE — Assessment & Plan Note (Signed)
Discussed checking an x-ray, see notes on imaging, discussed that he can have an occult fracture in a rib that does not show up on x-rays.  Treat symptomatically.  Use albuterol as needed.  Avoid using a rib belt.  Should gradually improve.

## 2020-07-11 ENCOUNTER — Other Ambulatory Visit: Payer: Self-pay

## 2020-07-11 ENCOUNTER — Ambulatory Visit: Admission: EM | Admit: 2020-07-11 | Discharge: 2020-07-11 | Discharge status: Absent without leave | Payer: Medicare PPO

## 2020-07-11 IMAGING — DX DG CHEST 2V
3 series · 3 of 3 positions shown · non-contrast
Comparison: Prior chest x-ray 01/02/2018

CLINICAL DATA: Right chest wall pain

EXAM:
CHEST - 2 VIEW

[chest pa]
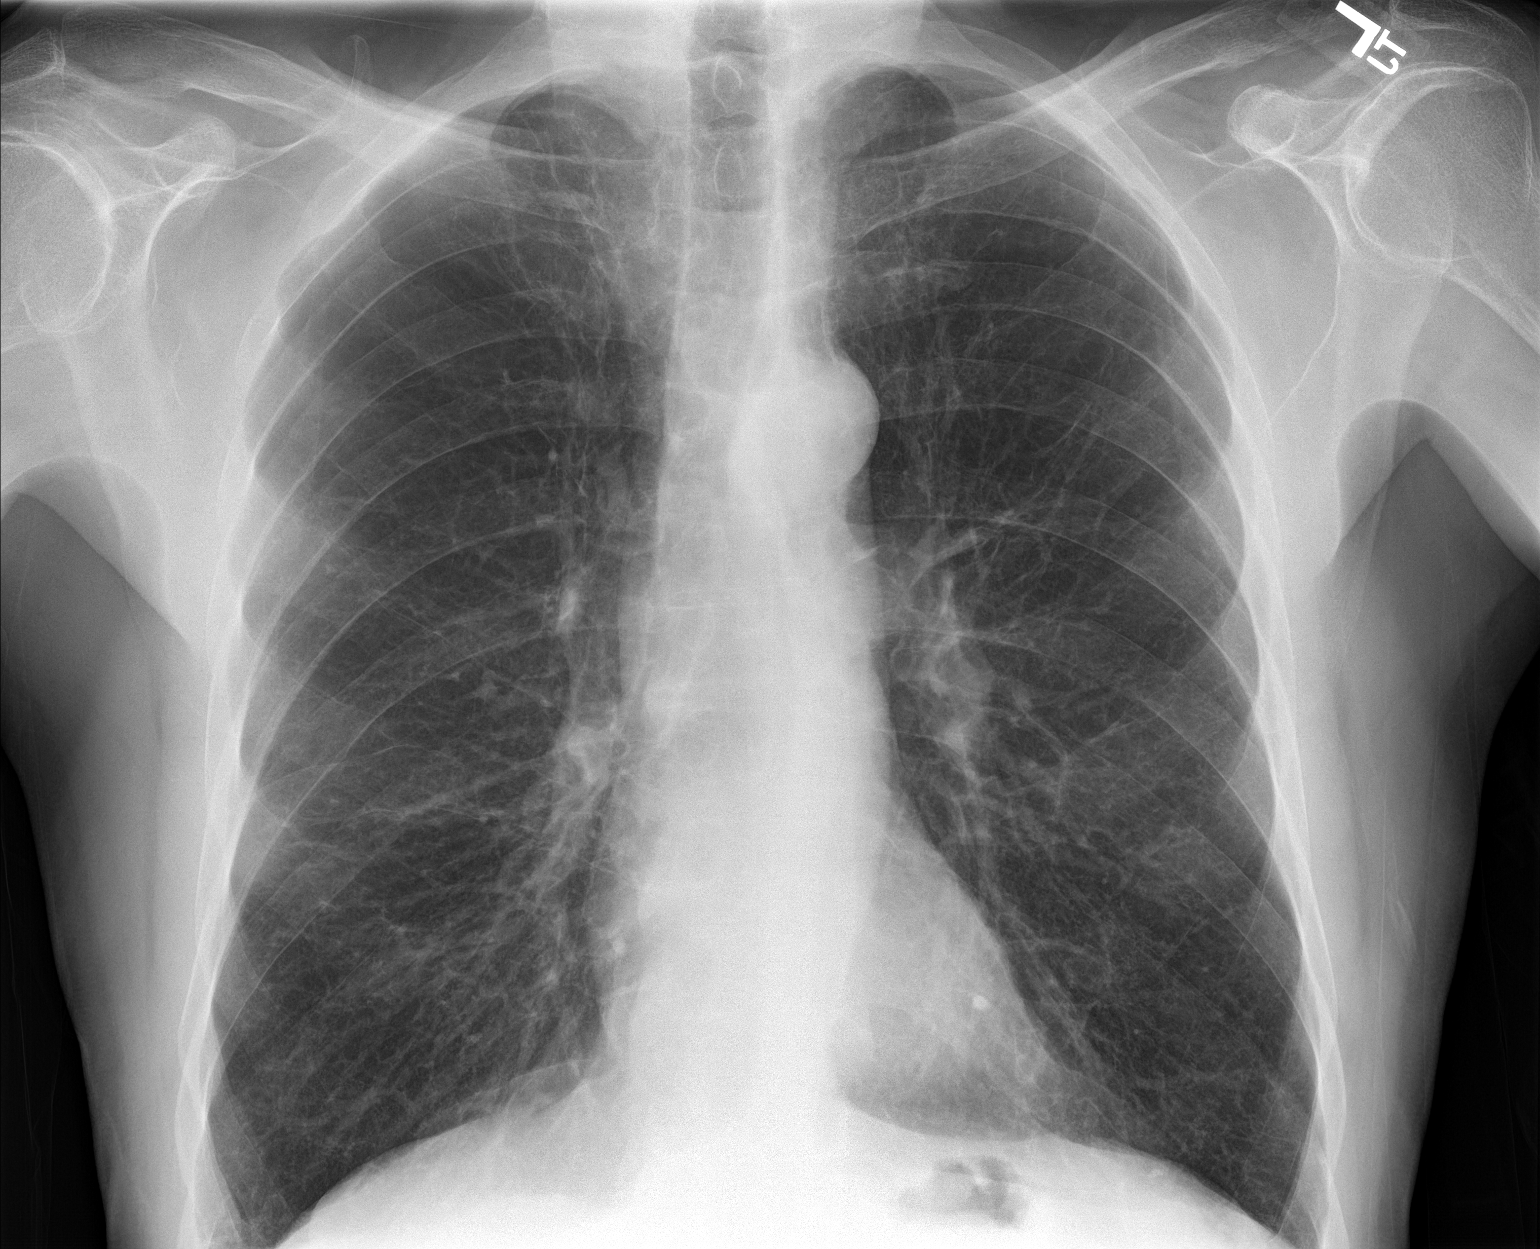

[chest lat]
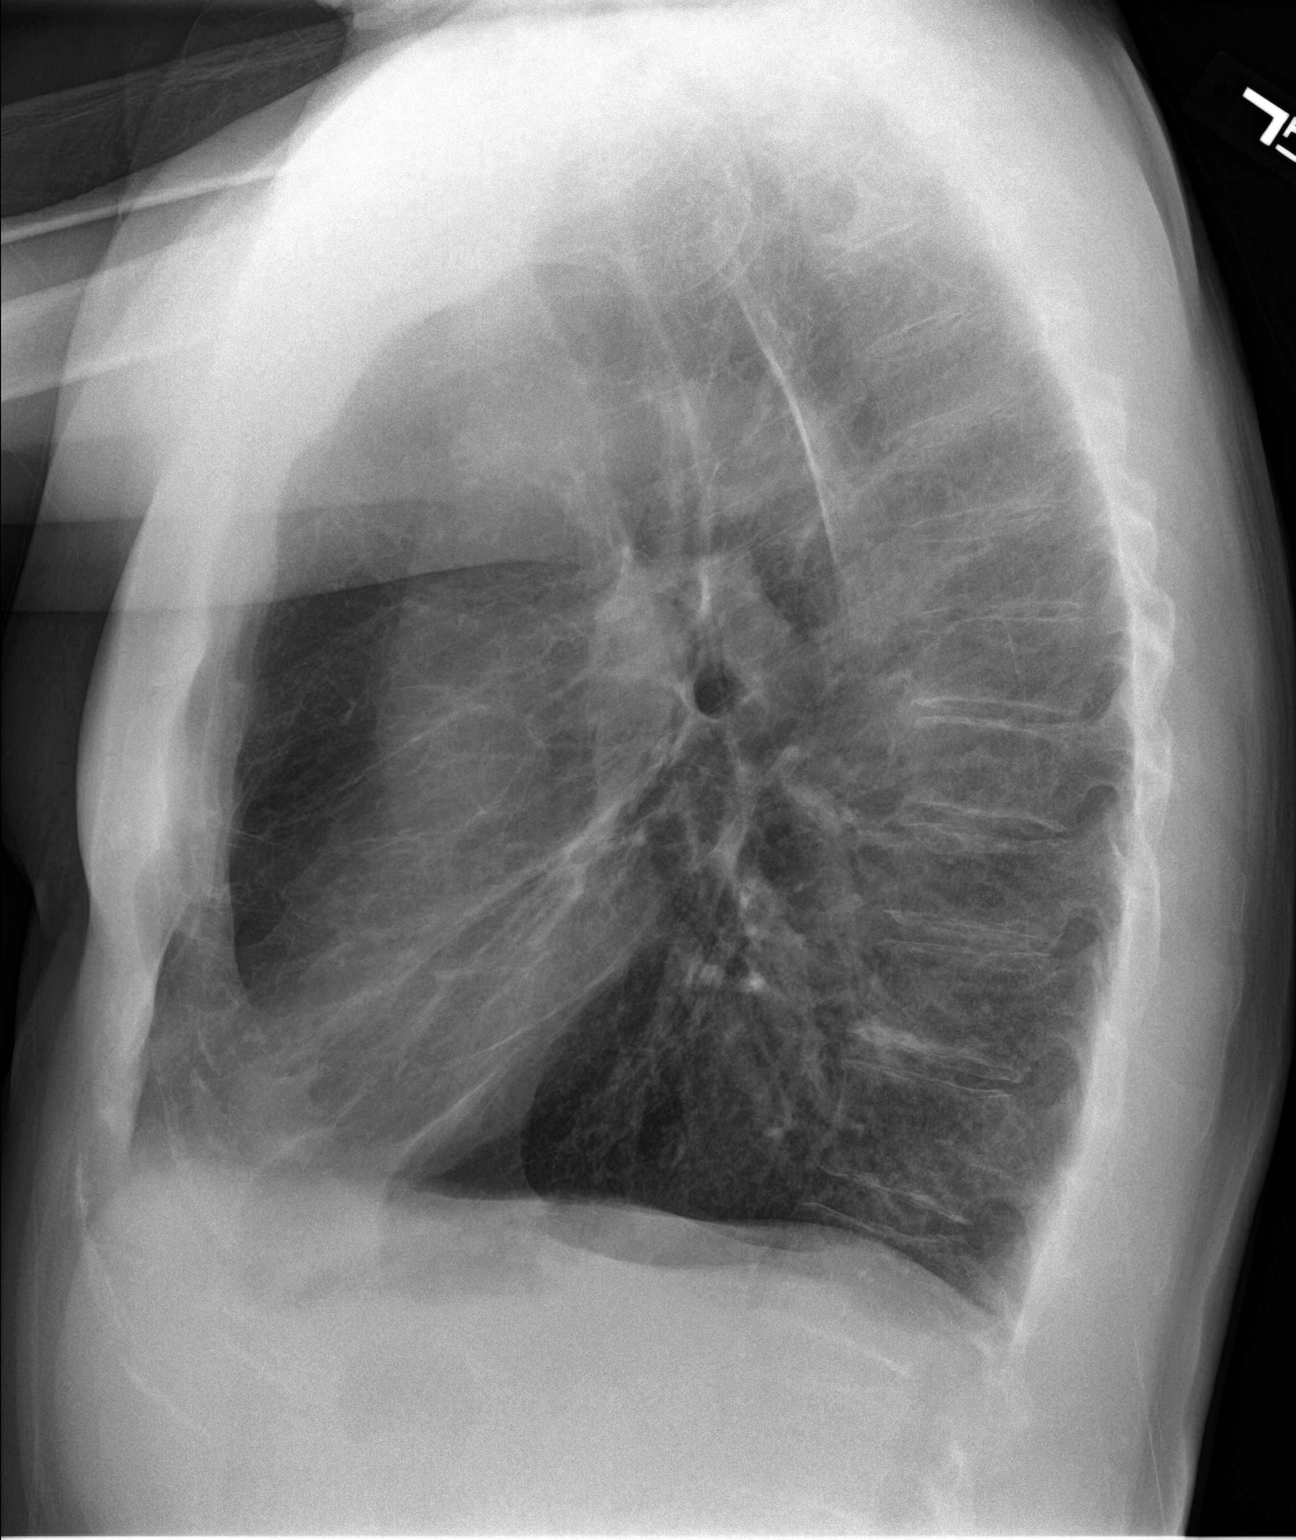

[chest ap]
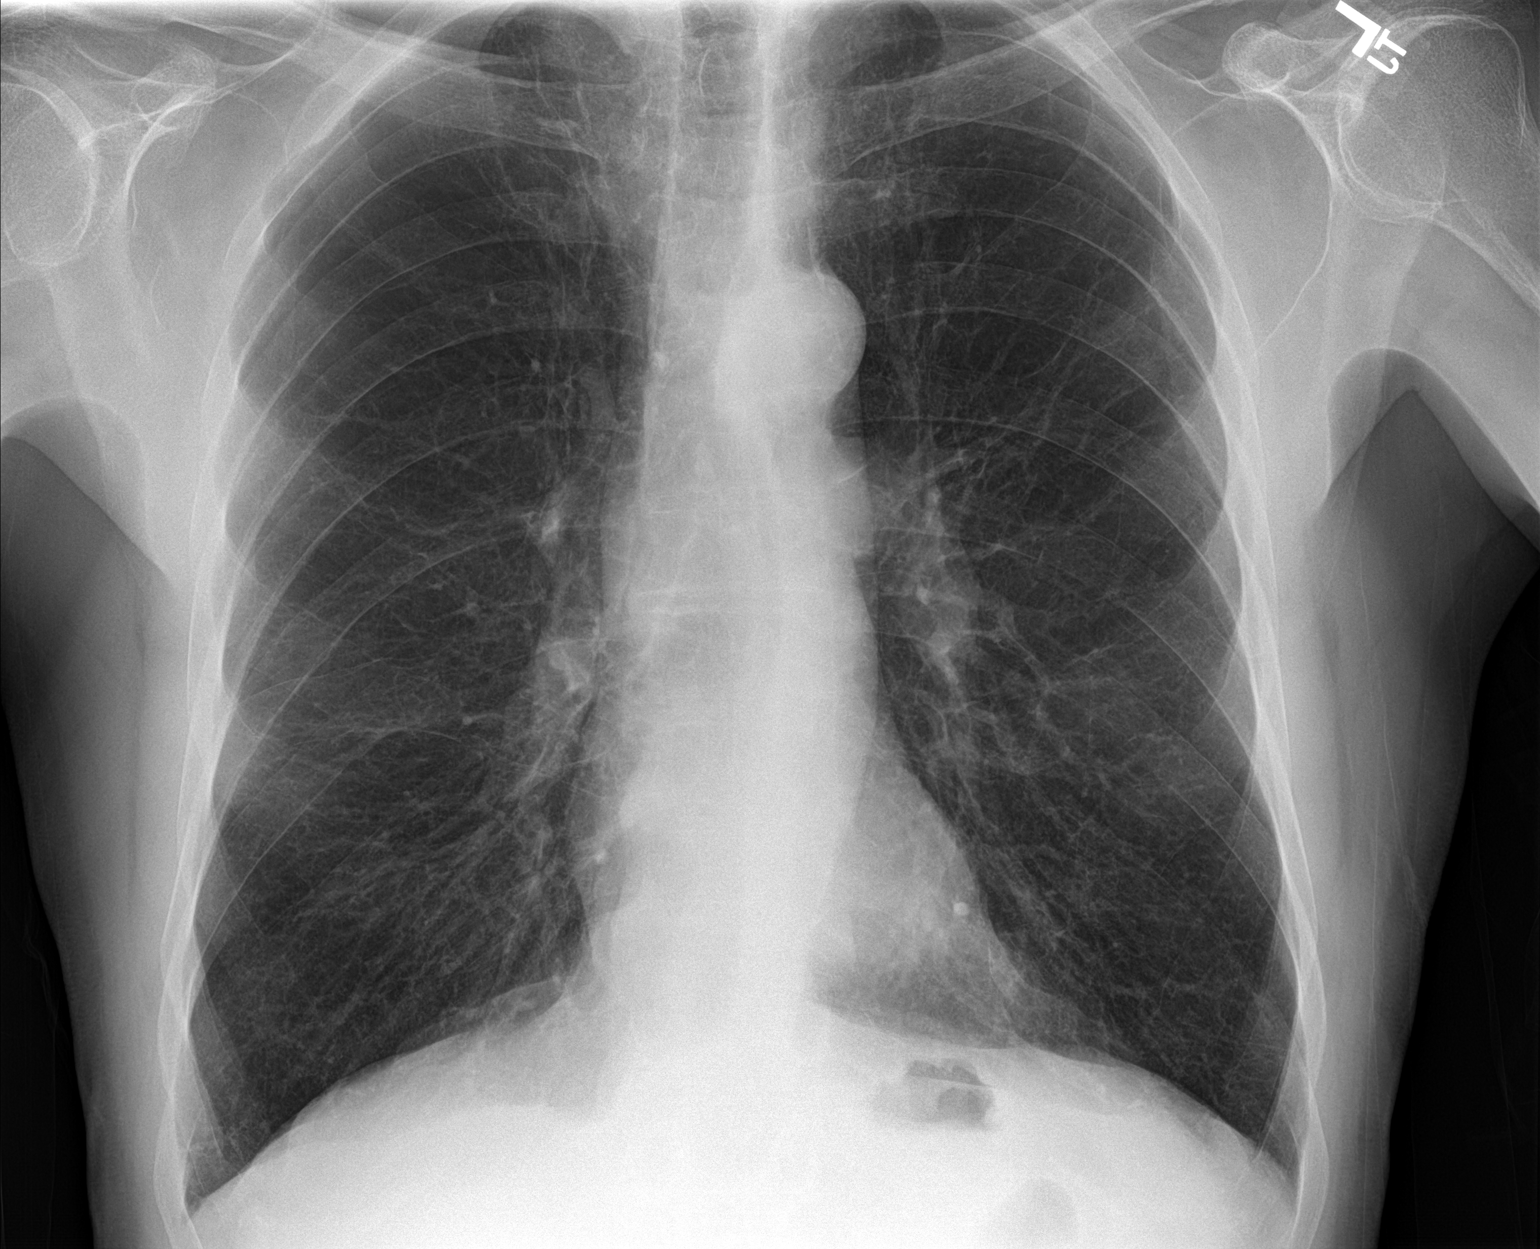

[3 of 3 positions shown; findings below may reference images not displayed]

FINDINGS: Stable cardiac and mediastinal contours. Bilateral pulmonary
hyperinflation with emphysematous changes throughout the lungs. Mild
chronic bronchitic changes again noted. Flattening of the
hemidiaphragms and increased retrosternal clear space on the lateral
view confirms hyperinflation. No acute osseous abnormality.
IMPRESSION: Stable chest x-ray without evidence of acute cardiopulmonary
process.

## 2020-09-11 DIAGNOSIS — J209 Acute bronchitis, unspecified: Secondary | ICD-10-CM | POA: Diagnosis not present

## 2020-09-11 DIAGNOSIS — J019 Acute sinusitis, unspecified: Secondary | ICD-10-CM | POA: Diagnosis not present

## 2020-09-11 DIAGNOSIS — S39012A Strain of muscle, fascia and tendon of lower back, initial encounter: Secondary | ICD-10-CM | POA: Diagnosis not present

## 2020-09-11 DIAGNOSIS — J441 Chronic obstructive pulmonary disease with (acute) exacerbation: Secondary | ICD-10-CM | POA: Diagnosis not present

## 2020-09-11 DIAGNOSIS — R509 Fever, unspecified: Secondary | ICD-10-CM | POA: Diagnosis not present

## 2020-09-11 DIAGNOSIS — B9689 Other specified bacterial agents as the cause of diseases classified elsewhere: Secondary | ICD-10-CM | POA: Diagnosis not present

## 2020-09-11 DIAGNOSIS — J9801 Acute bronchospasm: Secondary | ICD-10-CM | POA: Diagnosis not present

## 2020-09-11 DIAGNOSIS — R059 Cough, unspecified: Secondary | ICD-10-CM | POA: Diagnosis not present

## 2020-09-12 DIAGNOSIS — Z03818 Encounter for observation for suspected exposure to other biological agents ruled out: Secondary | ICD-10-CM | POA: Diagnosis not present

## 2020-09-12 DIAGNOSIS — R059 Cough, unspecified: Secondary | ICD-10-CM | POA: Diagnosis not present

## 2020-10-06 ENCOUNTER — Other Ambulatory Visit: Payer: Self-pay

## 2020-10-06 ENCOUNTER — Ambulatory Visit: Payer: Medicare PPO | Admitting: Family Medicine

## 2020-10-06 ENCOUNTER — Encounter: Payer: Self-pay | Admitting: Family Medicine

## 2020-10-06 DIAGNOSIS — R21 Rash and other nonspecific skin eruption: Secondary | ICD-10-CM

## 2020-10-06 DIAGNOSIS — R059 Cough, unspecified: Secondary | ICD-10-CM

## 2020-10-06 MED ORDER — ALBUTEROL SULFATE HFA 108 (90 BASE) MCG/ACT IN AERS: 1.0000 | INHALATION_SPRAY | Freq: Four times a day (QID) | RESPIRATORY_TRACT | 1 refills | Status: DC | PRN

## 2020-10-06 MED ORDER — TRIAMCINOLONE ACETONIDE 0.1 % EX CREA: 1.0000 "application " | TOPICAL_CREAM | Freq: Two times a day (BID) | CUTANEOUS | 2 refills | Status: DC | PRN

## 2020-10-06 NOTE — Progress Notes (Signed)
This visit occurred during the SARS-CoV-2 public health emergency.  Safety protocols were in place, including screening questions prior to the visit, additional usage of staff PPE, and extensive cleaning of exam room while observing appropriate contact time as indicated for disinfecting solutions.  His hand rash is slowly improving.  He needed refill on TAC cream.   He has used a new ear piece on hearing aid and that helps.  He has used cortisporin only as needed for irritation.    He had covid test positive 09/13/19.  D/w pt.  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  He likely pulled a muscle.  He thought that was the issue, then felt worse.  Went to UC.  Weaned off hydrocodone in the meantime.  SABA helped.  Taking albuterol 2 puffs BID at this point with improvement.  He was previously using it more frequently.  covid vaccine encouraged, d/w pt.    He had quit smoking.  D/w pt.  He was asking about seeing pulmonary.  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  Still with throat clearing.    He usually only needed SABA with illness, not o/w.  He is clearly better in the last few weeks.  He is back to working and clearly feels better in the last week.    Meds, vitals, and allergies reviewed.   ROS: Per HPI unless specifically indicated in ROS section   nad ncat Neck supple no LA rrr Ctab, no wheeze.  No cough. Abdomen soft.  Nontender. Skin well perfused. He has chronic but not acute changes on the skin on his hands bilaterally.

## 2020-10-06 NOTE — Patient Instructions (Signed)
Use the albuterol inhaler if needed.  I expect that you will gradually need it less and less and then stop needing it at all.  If you continue to need it, then let me know and we'll set you up with pulmonary.  Take care.  Glad to see you.

## 2020-10-08 NOTE — Assessment & Plan Note (Signed)
Would continue topical triamcinolone.  Prescription sent.

## 2020-10-08 NOTE — Assessment & Plan Note (Signed)
He had COVID, we talked about vaccination.  He quit smoking.  He usually only needs albuterol with concurrent illness and not otherwise.  He clearly feels better in the last few weeks.  We talked about options at this point.  He can try weaning off albuterol.  If he is not doing well we can always add on an inhaled steroid to see if that will help with his symptoms.  We can also refer him to pulmonary.  I do not see how referring him to pulmonary right at this moment would change the plan.  I think it makes sense to give him a little more time to get over his acute illness, see if he has any residual symptoms, see if he has any residual need for albuterol, and then let me know.  If he has a need for pulmonary evaluation in the future we can always refer him.  We talked about this and he agrees it is reasonable to defer the pulmonary evaluation for now.

## 2020-10-12 DIAGNOSIS — M85811 Other specified disorders of bone density and structure, right shoulder: Secondary | ICD-10-CM | POA: Diagnosis not present

## 2020-10-12 DIAGNOSIS — S4991XA Unspecified injury of right shoulder and upper arm, initial encounter: Secondary | ICD-10-CM | POA: Diagnosis not present

## 2020-10-12 DIAGNOSIS — M7989 Other specified soft tissue disorders: Secondary | ICD-10-CM | POA: Diagnosis not present

## 2020-10-13 DIAGNOSIS — M25511 Pain in right shoulder: Secondary | ICD-10-CM | POA: Diagnosis not present

## 2020-10-13 DIAGNOSIS — S4991XA Unspecified injury of right shoulder and upper arm, initial encounter: Secondary | ICD-10-CM | POA: Diagnosis not present

## 2020-10-13 DIAGNOSIS — S43201A Unspecified subluxation of right sternoclavicular joint, initial encounter: Secondary | ICD-10-CM | POA: Diagnosis not present

## 2021-03-14 ENCOUNTER — Other Ambulatory Visit: Payer: Self-pay

## 2021-03-14 MED ORDER — ALBUTEROL SULFATE HFA 108 (90 BASE) MCG/ACT IN AERS: 1.0000 | INHALATION_SPRAY | Freq: Four times a day (QID) | RESPIRATORY_TRACT | 1 refills | Status: DC | PRN

## 2021-03-22 ENCOUNTER — Other Ambulatory Visit: Payer: Self-pay

## 2021-03-22 ENCOUNTER — Ambulatory Visit: Payer: Medicare PPO | Admitting: Family Medicine

## 2021-03-22 ENCOUNTER — Encounter: Payer: Self-pay | Admitting: Family Medicine

## 2021-03-22 VITALS — BP 112/68 | HR 50 | Temp 97.1°F | Ht 69.0 in | Wt 167.2 lb

## 2021-03-22 DIAGNOSIS — Z1211 Encounter for screening for malignant neoplasm of colon: Secondary | ICD-10-CM

## 2021-03-22 DIAGNOSIS — R21 Rash and other nonspecific skin eruption: Secondary | ICD-10-CM

## 2021-03-22 LAB — BASIC METABOLIC PANEL
BUN: 18 mg/dL (ref 6–23)
CO2: 31 mEq/L (ref 19–32)
Calcium: 9.6 mg/dL (ref 8.4–10.5)
Chloride: 101 mEq/L (ref 96–112)
Creatinine, Ser: 0.84 mg/dL (ref 0.40–1.50)
GFR: 88.04 mL/min (ref >60.00)
Glucose, Bld: 96 mg/dL (ref 70–99)
Potassium: 4.3 mEq/L (ref 3.5–5.1)
Sodium: 137 mEq/L (ref 135–145)

## 2021-03-22 MED ORDER — PREDNISONE 10 MG PO TABS: ORAL_TABLET | ORAL | 0 refills | Status: DC

## 2021-03-22 NOTE — Patient Instructions (Addendum)
Take prednisone 2 a day for 10 days, then 1 a day for 10 days, with food. Don't take with aleve/ibuprofen. We'll call about seeing dermatology.    Let me know about the response to prednisone.  We may need to change the TAC cream at the end of the prednisone course.

## 2021-03-22 NOTE — Progress Notes (Signed)
This visit occurred during the SARS-CoV-2 public health emergency.  Safety protocols were in place, including screening questions prior to the visit, additional usage of staff PPE, and extensive cleaning of exam room while observing appropriate contact time as indicated for disinfecting solutions.  Rash.  Palms of hands are okay but diffuse chapping and cracking on the dorsum of the hands and fingers.  Now with similar on the shins.  Can itch.  Rash is really sensitive to hot water.  Today is a relatively good day.  Sx seem to be worse bacon or cinnamon.  He had prednisone course per derm prev.    D/w patient TD:[id number] for colon cancer screening, including IFOB vs. Colonoscopy vs cologuard.  Risks and benefits of both were discussed and patient voiced understanding.  Pt elects for: cologuard, ordered.    Meds, vitals, and allergies reviewed.   ROS: Per HPI unless specifically indicated in ROS section   Nad Ncat Neck supple, no LA Rrr Ctab Abd soft, not ttp Well demarcated blanching lesions on the B ant shins.  Dry and cracked skin on the dorsum of the hands B.

## 2021-03-25 DIAGNOSIS — Z1211 Encounter for screening for malignant neoplasm of colon: Secondary | ICD-10-CM | POA: Insufficient documentation

## 2021-03-25 NOTE — Assessment & Plan Note (Signed)
Discussed with patient about routine steroid cautions.  He can take prednisone 2 a day for 10 days, then 1 a day for 10 days, with food. Don't take with aleve/ibuprofen. We'll call about seeing dermatology.   I want him to let me know about the response to prednisone.  We may need to change the TAC cream at the end of the prednisone course, meaning he may need a higher potency steroid assuming he has response to prednisone.

## 2021-03-25 NOTE — Assessment & Plan Note (Signed)
Cologuard ordered

## 2021-04-03 DIAGNOSIS — Z1211 Encounter for screening for malignant neoplasm of colon: Secondary | ICD-10-CM | POA: Diagnosis not present

## 2021-04-04 ENCOUNTER — Encounter: Payer: Self-pay | Admitting: Family Medicine

## 2021-04-08 ENCOUNTER — Other Ambulatory Visit: Payer: Self-pay | Admitting: Family Medicine

## 2021-04-08 LAB — COLOGUARD: Cologuard: NEGATIVE

## 2021-04-08 MED ORDER — FLUOCINONIDE 0.05 % EX CREA: 1.0000 "application " | TOPICAL_CREAM | Freq: Two times a day (BID) | CUTANEOUS | 1 refills | Status: DC | PRN

## 2021-04-10 ENCOUNTER — Telehealth: Payer: Self-pay

## 2021-04-10 NOTE — Telephone Encounter (Signed)
PA has been denied. Denial response: The drug you asked for is not on your preferred drug list (formulary). The preferred drug(s), you may not have tried are: mometasone topical cream, betamethasone dipropionate topical cream AND betamethasone, augmented topical cream. Your provider needs to give [place] medical reasons why the preferred drug(s) would not work for you and/or would have bad side effects. Do you want to change rx?

## 2021-04-10 NOTE — Telephone Encounter (Signed)
PA started in covermymeds.com for fluocinonide 0.05% cream. Key: [id number]. Awaiting determination.

## 2021-04-11 MED ORDER — BETAMETHASONE DIPROPIONATE 0.05 % EX CREA: TOPICAL_CREAM | Freq: Two times a day (BID) | CUTANEOUS | 1 refills | Status: DC | PRN

## 2021-04-11 NOTE — Telephone Encounter (Signed)
Could change to betamethasone dipropionate.  Please notify patient.  Reasonable substitution.  Prescription sent.  Thanks.

## 2021-04-11 NOTE — Telephone Encounter (Signed)
Patient notified of rx change. 

## 2021-04-11 NOTE — Addendum Note (Signed)
Addended by: Joaquim Nam on: 04/11/2021 01:24 PM   Modules accepted: Orders

## 2021-04-23 DIAGNOSIS — Z8781 Personal history of (healed) traumatic fracture: Secondary | ICD-10-CM | POA: Diagnosis not present

## 2021-04-23 DIAGNOSIS — R918 Other nonspecific abnormal finding of lung field: Secondary | ICD-10-CM | POA: Diagnosis not present

## 2021-04-23 DIAGNOSIS — S299XXA Unspecified injury of thorax, initial encounter: Secondary | ICD-10-CM | POA: Diagnosis not present

## 2021-05-26 ENCOUNTER — Other Ambulatory Visit: Payer: Self-pay

## 2021-05-26 ENCOUNTER — Ambulatory Visit (INDEPENDENT_AMBULATORY_CARE_PROVIDER_SITE_OTHER): Payer: Medicare PPO

## 2021-05-26 DIAGNOSIS — Z0000 Encounter for general adult medical examination without abnormal findings: Secondary | ICD-10-CM | POA: Diagnosis not present

## 2021-05-26 NOTE — Progress Notes (Signed)
Subjective:   Roger Lewis is a 70 y.o. male who presents for Medicare Annual/Subsequent preventive examination.  I connected with  [name] on 05/26/21 by a audio enabled telemedicine application and verified that I am speaking with the correct person using two identifiers.   I discussed the limitations of evaluation and management by telemedicine. The patient expressed understanding and agreed to proceed.   Location of Patient: Home Location of Provider: Office Persons Participating in Visit: Burgess Estelle CMA & Karene Fry. [place]  Review of Systems    Defer to PCP Cardiac Risk Factors include: none     Objective:    There were no vitals filed for this visit. There is no height or weight on file to calculate BMI.  Advanced Directives 05/26/2021  Does Patient Have a Medical Advance Directive? No  Would patient like information on creating a medical advance directive? Yes (Inpatient - patient defers creating a medical advance directive and declines information at this time)    Current Medications (verified) Outpatient Encounter Medications as of 05/26/2021  Medication Sig   albuterol (VENTOLIN HFA) 108 (90 Base) MCG/ACT inhaler Inhale 1-2 puffs into the lungs every 6 (six) hours as needed for wheezing or shortness of breath.   betamethasone dipropionate 0.05 % cream Apply topically 2 (two) times daily as needed. Use only on affected skin.  Do not use on the face.   Multiple Vitamin (MULTIVITAMIN) tablet Take 1 tablet by mouth daily.   [DISCONTINUED] neomycin-polymyxin-hydrocortisone (CORTISPORIN) OTIC solution Place 3 drops into both ears 4 (four) times daily.   [DISCONTINUED] predniSONE (DELTASONE) 10 MG tablet Take 2 a day for 10 days, then 1 a day for 10 days, with food. Don't take with aleve/ibuprofen.   No facility-administered encounter medications on file as of 05/26/2021.    Allergies (verified) Penicillins and Flexeril [cyclobenzaprine]   History: Past  Medical History:  Diagnosis Date   GERD (gastroesophageal reflux disease)    Renal stones    No past surgical history on file. Family History  Problem Relation Age of Onset   COPD Mother    COPD Father    Prostate cancer Paternal Uncle    Coronary artery disease Brother 44       multiple stents   Colon cancer Neg Hx    Social History   Socioeconomic History   Marital status: Married    Spouse name: Not on file   Number of children: Not on file   Years of education: Not on file   Highest education level: Not on file  Occupational History   Not on file  Tobacco Use   Smoking status: Former    Packs/day: 1.00    Years: 40.00    Pack years: 40.00    Types: Cigarettes    Quit date: 09/09/2008    Years since quitting: 12.7   Smokeless tobacco: Never  Substance and Sexual Activity   Alcohol use: No    Alcohol/week: 0.0 standard drinks    Comment: Very rare   Drug use: Never   Sexual activity: Not Currently  Other Topics Concern   Not on file  Social History Narrative   Likes to [employer]- keyboard, guitar, drums   Self employed concrete work/excavations   Raises beef cattle   2 kids, 2 step kids.     Divorced.  Lives alone   From [place]   Social Determinants of Health   Financial Resource Strain: Low Risk    Difficulty  of Paying Living Expenses: Not hard at all  Food Insecurity: No Food Insecurity   Worried About Running Out of Food in the Last Year: Never true   Ran Out of Food in the Last Year: Never true  Transportation Needs: No Transportation Needs   Lack of Transportation (Medical): No   Lack of Transportation (Non-Medical): No  Physical Activity: Insufficiently Active   Days of Exercise per Week: 1 day   Minutes of Exercise per Session: 60 min  Stress: No Stress Concern Present   Feeling of Stress : Not at all  Social Connections: Moderately Integrated   Frequency of Communication with Friends and Family: More than three times a week    Frequency of Social Gatherings with Friends and Family: More than three times a week   Attends Religious Services: More than 4 times per year   Active Member of [employer] or Organizations: Yes   Attends Banker Meetings: More than 4 times per year   Marital Status: Widowed    Tobacco Counseling Counseling given: Not Answered   Clinical Intake:  Pre-visit preparation completed: Yes  Pain : No/denies pain     Diabetes: No  How often do you need to have someone help you when you read instructions, pamphlets, or other written materials from your doctor or pharmacy?: 1 - Never What is the last grade level you completed in school?: Some College Course  Diabetic?NO  Interpreter Needed?: No      Activities of Daily Living In your present state of health, do you have any difficulty performing the following activities: 05/26/2021 03/22/2021  Hearing? N Y  Vision? N N  Difficulty concentrating or making decisions? N N  Walking or climbing stairs? N N  Dressing or bathing? N N  Doing errands, shopping? N N  Preparing Food and eating ? N -  Using the Toilet? N -  In the past six months, have you accidently leaked urine? N -  Do you have problems with loss of bowel control? N -  Managing your Medications? N -  Managing your Finances? N -  Housekeeping or managing your Housekeeping? N -  Some recent data might be hidden    Patient Care Team: Joaquim Nam, MD as PCP - General (Family Medicine)  Indicate any recent Medical Services you may have received from other than [facility] providers in the past year (date may be approximate).     Assessment:   This is a routine wellness examination for [facility].  Hearing/Vision screen No results found.  Dietary issues and exercise activities discussed: Current Exercise Habits: Home exercise routine, Time (Minutes): 60, Frequency (Times/Week): 1, Weekly Exercise (Minutes/Week): 60, Intensity: Moderate   Goals Addressed    None    Depression Screen PHQ 2/9 Scores 05/26/2021 03/22/2021 11/17/2017 11/14/2015  PHQ - 2 Score 0 0 0 2  PHQ- 9 Score - 0 - -    Fall Risk Fall Risk  05/26/2021 10/06/2020 11/17/2017 11/14/2015  Falls in the past year? 0 0 No No  Number falls in past yr: 0 0 - -  Injury with Fall? 0 0 - -  Follow up Falls evaluation completed Falls evaluation completed - -    FALL RISK PREVENTION PERTAINING TO THE HOME:  Any stairs in or around the home? No  If so, are there any without handrails? No  Home free of loose throw rugs in walkways, pet beds, electrical cords, etc? Yes  Adequate lighting in your home to reduce  risk of falls? Yes   ASSISTIVE DEVICES UTILIZED TO PREVENT FALLS:  Life alert? No  Use of a cane, walker or w/c? No  Grab bars in the bathroom? No  Shower chair or bench in shower? No  Elevated toilet seat or a handicapped toilet? No   TIMED UP AND GO:  Was the test performed? No .  Length of time to ambulate 10 feet: N/A sec.   Gait steady and fast without use of assistive device  Cognitive Function:     6CIT Screen 05/26/2021  What Year? 0 points  What month? 0 points  What time? 0 points  Count back from 20 0 points  Months in reverse 0 points  Repeat phrase 0 points  Total Score 0    Immunizations Immunization History  Administered Date(s) Administered   Influenza, High Dose Seasonal PF 10/20/2017   Influenza,inj,Quad PF,6+ Mos 07/20/2016   Influenza-Unspecified 10/20/2017, 09/19/2018   Pneumococcal Conjugate-13 11/17/2017    TDAP status: Due, Education has been provided regarding the importance of this vaccine. Advised may receive this vaccine at local pharmacy or Health Dept. Aware to provide a copy of the vaccination record if obtained from local pharmacy or Health Dept. Verbalized acceptance and understanding.  Flu Vaccine status: Due, Education has been provided regarding the importance of this vaccine. Advised may receive this vaccine at local  pharmacy or Health Dept. Aware to provide a copy of the vaccination record if obtained from local pharmacy or Health Dept. Verbalized acceptance and understanding.  Pneumococcal vaccine status: Due, Education has been provided regarding the importance of this vaccine. Advised may receive this vaccine at local pharmacy or Health Dept. Aware to provide a copy of the vaccination record if obtained from local pharmacy or Health Dept. Verbalized acceptance and understanding.  Covid-19 vaccine status: Declined, Education has been provided regarding the importance of this vaccine but patient still declined. Advised may receive this vaccine at local pharmacy or Health Dept.or vaccine clinic. Aware to provide a copy of the vaccination record if obtained from local pharmacy or Health Dept. Verbalized acceptance and understanding.  Qualifies for Shingles Vaccine? No   Zostavax completed No   Shingrix Completed?: No.    Education has been provided regarding the importance of this vaccine. Patient has been advised to call insurance company to determine out of pocket expense if they have not yet received this vaccine. Advised may also receive vaccine at local pharmacy or Health Dept. Verbalized acceptance and understanding.  Screening Tests Health Maintenance  Topic Date Due   COVID-19 Vaccine (1) Never done   TETANUS/TDAP  Never done   Zoster Vaccines- Shingrix (1 of 2) Never done   INFLUENZA VACCINE  04/09/2021   Fecal DNA (Cologuard)  04/03/2024   Hepatitis C Screening  Completed   HPV VACCINES  Aged Out    Health Maintenance  Health Maintenance Due  Topic Date Due   COVID-19 Vaccine (1) Never done   TETANUS/TDAP  Never done   Zoster Vaccines- Shingrix (1 of 2) Never done   INFLUENZA VACCINE  04/09/2021    Colorectal cancer screening: Type of screening: Cologuard. Completed 04/03/2021. Repeat every 3 years  Lung Cancer Screening: (Low Dose CT Chest recommended if Age 34-80 years, 30 pack-year  currently smoking OR have quit w/in 15years.) does qualify.   Lung Cancer Screening Referral: n/a  Additional Screening:  Hepatitis C Screening: does qualify; Completed 11/14/2015  Vision Screening: Recommended annual ophthalmology exams for early detection of glaucoma and other  disorders of the eye. Is the patient up to date with their annual eye exam?  Yes  Who is the provider or what is the name of the office in which the patient attends annual eye exams? YES If pt is not established with a provider, would they like to be referred to a provider to establish care? No .   Dental Screening: Recommended annual dental exams for proper oral hygiene  Community Resource Referral / Chronic Care Management: CRR required this visit?  No   CCM required this visit?  No      Plan:     I have personally reviewed and noted the following in the patient's chart:   Medical and social history Use of alcohol, tobacco or illicit drugs  Current medications and supplements including opioid prescriptions. Patient is not currently taking opioid prescriptions. Functional ability and status Nutritional status Physical activity Advanced directives List of other physicians Hospitalizations, surgeries, and ER visits in previous 12 months Vitals Screenings to include cognitive, depression, and falls Referrals and appointments  In addition, I have reviewed and discussed with patient certain preventive protocols, quality metrics, and best practice recommendations. A written personalized care plan for preventive services as well as general preventive health recommendations were provided to patient.     Burgess Estelle, [facility]   05/26/2021   Nurse Notes: Non Face to Face  Mr. Wenker , Thank you for taking time to come for your Medicare Wellness Visit. I appreciate your ongoing commitment to your health goals. Please review the following plan we discussed and let me know if I can assist you in the future.    These are the goals we discussed:  Goals   None     This is a list of the screening recommended for you and due dates:  Health Maintenance  Topic Date Due   COVID-19 Vaccine (1) Never done   Tetanus Vaccine  Never done   Zoster (Shingles) Vaccine (1 of 2) Never done   Flu Shot  04/09/2021   Cologuard (Stool DNA test)  04/03/2024   Hepatitis C Screening: USPSTF Recommendation to screen - Ages 11-79 yo.  Completed   HPV Vaccine  Aged Out

## 2021-08-01 ENCOUNTER — Other Ambulatory Visit: Payer: Self-pay | Admitting: Family Medicine

## 2021-08-24 ENCOUNTER — Other Ambulatory Visit: Payer: Self-pay

## 2021-08-24 ENCOUNTER — Telehealth (INDEPENDENT_AMBULATORY_CARE_PROVIDER_SITE_OTHER): Payer: Medicare PPO | Admitting: Primary Care

## 2021-08-24 ENCOUNTER — Encounter: Payer: Self-pay | Admitting: Primary Care

## 2021-08-24 DIAGNOSIS — J3489 Other specified disorders of nose and nasal sinuses: Secondary | ICD-10-CM | POA: Diagnosis not present

## 2021-08-24 NOTE — Patient Instructions (Signed)
Take 20 mg of prednisone (2 pills) by mouth once daily for five days.  You may use saline nasal spray if needed.  Please update [place] next week if no better.  It was a pleasure to see you today!

## 2021-08-24 NOTE — Assessment & Plan Note (Signed)
Does not appear bacterial at this point. He is no worse and he appears well.  He has prednisone 10 mg tablets that are not expired, I reviewed the bottle myself. Will have him take 20 mg daily x 5 days.  Discussed use of Tylenol and saline nasal spray.  He will update if no improvement.

## 2021-08-24 NOTE — Progress Notes (Signed)
Patient ID: Roger Lewis, male    DOB: 01/20/1951, 70 y.o.   MRN: [id number]  Virtual visit completed through [employer] , a video enabled telemedicine application. Due to national recommendations of social distancing due to COVID-19, a virtual visit is felt to be most appropriate for this patient at this time. Reviewed limitations, risks, security and privacy concerns of performing a virtual visit and the availability of in person appointments. I also reviewed that there may be a patient responsible charge related to this service. The patient agreed to proceed.   Patient location: home Provider location: [place] at [facility], office Persons participating in this virtual visit: patient, provider   If any vitals were documented, they were collected by patient at home unless specified below.    Pulse 90    Ht 5\' 9"  (1.753 m)    Wt 167 lb (75.8 kg)    SpO2 94%    BMI 24.66 kg/m    CC: Facial Pain Subjective:   HPI: Roger Lewis is a 70 y.o. male patient of Dr. 62 with a history of cough, chest wall pain, otitis externa presenting on 08/24/2021 for facial pain.  His pain is located to the left frontal lobe above his left eye which he first noticed about three to four days ago. He also reports post nasal drip, runny, and mild cough. He was burning leaves in his yard last week.   He denies fevers, chills, body aches. He's taken Tylenol and Motrin together with temporary improvement. He is no worse today, but overall no better.   He has a history of the same symptoms last year, was diagnosed with sinusitis. He has tried Zicam historically but has not helped.   He has some prednisone 10 mg tablets left from a prescription that was prescribed by his PCP in July 2022. He is sensitive to all medications and typically takes half doses of what is prescribed.      Relevant past medical, surgical, family and social history reviewed and updated as indicated. Interim medical history since  our last visit reviewed. Allergies and medications reviewed and updated. Outpatient Medications Prior to Visit  Medication Sig Dispense Refill   albuterol (VENTOLIN HFA) 108 (90 Base) MCG/ACT inhaler INHALE 1 TO 2 PUFFS BY MOUTH EVERY 6 HOURS AS NEEDED FOR SHORTNESS OF BREATH/WHEEZING 8 each 3   betamethasone dipropionate 0.05 % cream Apply topically 2 (two) times daily as needed. Use only on affected skin.  Do not use on the face. 30 g 1   Multiple Vitamin (MULTIVITAMIN) tablet Take 1 tablet by mouth daily.     No facility-administered medications prior to visit.     Per HPI unless specifically indicated in ROS section below Review of Systems  HENT:  Positive for postnasal drip, rhinorrhea and sinus pressure.   Respiratory:  Positive for cough.   Objective:  Pulse 90    Ht 5\' 9"  (1.753 m)    Wt 167 lb (75.8 kg)    SpO2 94%    BMI 24.66 kg/m   Wt Readings from Last 3 Encounters:  08/24/21 167 lb (75.8 kg)  03/22/21 167 lb 3 oz (75.8 kg)  10/06/20 162 lb (73.5 kg)       Physical exam: General: Alert and oriented x 3, no distress, does not appear sickly  Pulmonary: Speaks in complete sentences without increased work of breathing, no cough during visit.  Psychiatric: Normal mood, thought content, and behavior.     Results  for orders placed or performed in visit on 03/22/21  Cologuard  Result Value Ref Range   Cologuard Negative Negative  Basic metabolic panel  Result Value Ref Range   Sodium 137 135 - 145 mEq/L   Potassium 4.3 3.5 - 5.1 mEq/L   Chloride 101 96 - 112 mEq/L   CO2 31 19 - 32 mEq/L   Glucose, Bld 96 70 - 99 mg/dL   BUN 18 6 - 23 mg/dL   Creatinine, Ser 4.09 0.40 - 1.50 mg/dL   GFR 73.53 >29.92 mL/min   Calcium 9.6 8.4 - 10.5 mg/dL   Assessment & Plan:   Problem List Items Addressed This Visit       Other   Sinus pressure    Does not appear bacterial at this point. He is no worse and he appears well.  He has prednisone 10 mg tablets that are not  expired, I reviewed the bottle myself. Will have him take 20 mg daily x 5 days.  Discussed use of Tylenol and saline nasal spray.  He will update if no improvement.        No orders of the defined types were placed in this encounter.  No orders of the defined types were placed in this encounter.   I discussed the assessment and treatment plan with the patient. The patient was provided an opportunity to ask questions and all were answered. The patient agreed with the plan and demonstrated an understanding of the instructions. The patient was advised to call back or seek an in-person evaluation if the symptoms worsen or if the condition fails to improve as anticipated.  Follow up plan:  Take 20 mg of prednisone (2 pills) by mouth once daily for five days.  You may use saline nasal spray if needed.  Please update [place] next week if no better.  It was a pleasure to see you today!   Doreene Nest, NP

## 2021-08-31 DIAGNOSIS — R0781 Pleurodynia: Secondary | ICD-10-CM | POA: Diagnosis not present

## 2021-09-25 DIAGNOSIS — H524 Presbyopia: Secondary | ICD-10-CM | POA: Diagnosis not present

## 2021-09-25 DIAGNOSIS — H2513 Age-related nuclear cataract, bilateral: Secondary | ICD-10-CM | POA: Diagnosis not present

## 2021-09-25 DIAGNOSIS — H52223 Regular astigmatism, bilateral: Secondary | ICD-10-CM | POA: Diagnosis not present

## 2021-09-25 DIAGNOSIS — H5203 Hypermetropia, bilateral: Secondary | ICD-10-CM | POA: Diagnosis not present

## 2021-09-25 DIAGNOSIS — H43811 Vitreous degeneration, right eye: Secondary | ICD-10-CM | POA: Diagnosis not present

## 2021-10-09 ENCOUNTER — Other Ambulatory Visit: Payer: Self-pay

## 2021-10-09 ENCOUNTER — Encounter: Payer: Self-pay | Admitting: Family Medicine

## 2021-10-09 ENCOUNTER — Ambulatory Visit (INDEPENDENT_AMBULATORY_CARE_PROVIDER_SITE_OTHER): Payer: Medicare PPO | Admitting: Family Medicine

## 2021-10-09 ENCOUNTER — Ambulatory Visit (INDEPENDENT_AMBULATORY_CARE_PROVIDER_SITE_OTHER)
Admission: RE | Admit: 2021-10-09 | Discharge: 2021-10-09 | Disposition: A | Discharge status: Home / Self Care | Payer: Medicare PPO | Source: Ambulatory Visit | Attending: Family Medicine | Admitting: Family Medicine

## 2021-10-09 VITALS — BP 102/60 | HR 72 | Temp 97.7°F | Ht 69.0 in | Wt 166.0 lb

## 2021-10-09 DIAGNOSIS — Z0000 Encounter for general adult medical examination without abnormal findings: Secondary | ICD-10-CM

## 2021-10-09 DIAGNOSIS — E785 Hyperlipidemia, unspecified: Secondary | ICD-10-CM | POA: Diagnosis not present

## 2021-10-09 DIAGNOSIS — R0602 Shortness of breath: Secondary | ICD-10-CM

## 2021-10-09 DIAGNOSIS — K219 Gastro-esophageal reflux disease without esophagitis: Secondary | ICD-10-CM | POA: Diagnosis not present

## 2021-10-09 DIAGNOSIS — Z125 Encounter for screening for malignant neoplasm of prostate: Secondary | ICD-10-CM | POA: Diagnosis not present

## 2021-10-09 DIAGNOSIS — Z7189 Other specified counseling: Secondary | ICD-10-CM

## 2021-10-09 DIAGNOSIS — R21 Rash and other nonspecific skin eruption: Secondary | ICD-10-CM | POA: Diagnosis not present

## 2021-10-09 DIAGNOSIS — J439 Emphysema, unspecified: Secondary | ICD-10-CM | POA: Diagnosis not present

## 2021-10-09 MED ORDER — TRIAMCINOLONE ACETONIDE 0.5 % EX CREA: 1.0000 "application " | TOPICAL_CREAM | Freq: Two times a day (BID) | CUTANEOUS | 1 refills | Status: DC | PRN

## 2021-10-09 MED ORDER — FLUTICASONE PROPIONATE HFA 110 MCG/ACT IN AERO: 2.0000 | INHALATION_SPRAY | Freq: Two times a day (BID) | RESPIRATORY_TRACT | 12 refills | Status: DC

## 2021-10-09 NOTE — Patient Instructions (Addendum)
Go to the lab on the way out.   If you have mychart we'll likely use that to update you.    Take care.  Glad to see you. Use the higher strength TAC cream and see if that helps.  Try using flovent and see if your breathing improves. Either way, let me know in about 10 days.   Elevate the head of your bed.

## 2021-10-09 NOTE — Progress Notes (Signed)
This visit occurred during the SARS-CoV-2 public health emergency.  Safety protocols were in place, including screening questions prior to the visit, additional usage of staff PPE, and extensive cleaning of exam room while observing appropriate contact time as indicated for disinfecting solutions.  Flu encouraged. Shingles d/w pt  PNA 2019 Tetanus d/w pt.  COVID vaccine encouraged. Cologuard neg 2022 Prostate cancer screening pending. D/w pt.  Advance directive- son Marcial Pacas designated if patient were incapacitated.  HCV screen prev neg.    H/o rash.  Diffuse rash on the hands.  Similar on the L shin. Itchy, worse if he scratches it.  Chronic rash.  He has used some TAC prev, that helps some.  Other creams were more expensive.  D/w pt trying 0.5% TAC cream.  Rx sent.  Routine cautions sent.  He noted the rash was worse after eating foods that had cinnamon, unclear if this is related but he is avoiding it in the meantime.  H/o GERD generally controlled with 5mg  pepcid qd prn.  He had nocturnal regurgitation x3 nights after a late night at work and eating soon after dinner.  Dinner timing cautions d/w pt.  Discussed elevating the HOB.    SOB noted by patient.  He is still active at baseline.  Not SOB at rest but has sx initially that resolves with continued exertion.  Going on for months.  No CP.  His sx are slowly getting better over the months but still present.  No wheeze.  Some cough.  Some improvement with SABA.  H/o neg stress test with cards prev. He quit smoking.    Meds, vitals, and allergies reviewed.   ROS: Per HPI unless specifically indicated in ROS section   GEN: nad, alert and oriented HEENT: ncat NECK: supple w/o LA CV: rrr. PULM: ctab, no inc wob ABD: soft, +bs EXT: no edema SKIN: no acute rash but chronic rash on the B, R>L, hands, extensor more than palmar, less so on the L shin.

## 2021-10-10 DIAGNOSIS — Z0000 Encounter for general adult medical examination without abnormal findings: Secondary | ICD-10-CM | POA: Insufficient documentation

## 2021-10-10 DIAGNOSIS — R0602 Shortness of breath: Secondary | ICD-10-CM | POA: Insufficient documentation

## 2021-10-10 DIAGNOSIS — K219 Gastro-esophageal reflux disease without esophagitis: Secondary | ICD-10-CM | POA: Insufficient documentation

## 2021-10-10 LAB — COMPREHENSIVE METABOLIC PANEL
ALT: 14 U/L (ref 0–53)
AST: 16 U/L (ref 0–37)
Albumin: 4.4 g/dL (ref 3.5–5.2)
Alkaline Phosphatase: 86 U/L (ref 39–117)
BUN: 27 mg/dL — ABNORMAL HIGH (ref 6–23)
CO2: 34 mEq/L — ABNORMAL HIGH (ref 19–32)
Calcium: 10 mg/dL (ref 8.4–10.5)
Chloride: 104 mEq/L (ref 96–112)
Creatinine, Ser: 0.78 mg/dL (ref 0.40–1.50)
GFR: 89.69 mL/min (ref >60.00)
Glucose, Bld: 100 mg/dL — ABNORMAL HIGH (ref 70–99)
Potassium: 4.6 mEq/L (ref 3.5–5.1)
Sodium: 143 mEq/L (ref 135–145)
Total Bilirubin: 0.4 mg/dL (ref 0.2–1.2)
Total Protein: 7 g/dL (ref 6.0–8.3)

## 2021-10-10 LAB — CBC WITH DIFFERENTIAL/PLATELET
Basophils Absolute: 0 10*3/uL (ref 0.0–0.1)
Basophils Relative: 0.8 % (ref 0.0–3.0)
Eosinophils Absolute: 0.2 10*3/uL (ref 0.0–0.7)
Eosinophils Relative: 2.9 % (ref 0.0–5.0)
HCT: 38 % — ABNORMAL LOW (ref 39.0–52.0)
Hemoglobin: 12.8 g/dL — ABNORMAL LOW (ref 13.0–17.0)
Lymphocytes Relative: 25.2 % (ref 12.0–46.0)
Lymphs Abs: 1.6 10*3/uL (ref 0.7–4.0)
MCHC: 33.7 g/dL (ref 30.0–36.0)
MCV: 87.1 fl (ref 78.0–100.0)
Monocytes Absolute: 0.6 10*3/uL (ref 0.1–1.0)
Monocytes Relative: 10.1 % (ref 3.0–12.0)
Neutro Abs: 3.8 10*3/uL (ref 1.4–7.7)
Neutrophils Relative %: 61 % (ref 43.0–77.0)
Platelets: 260 10*3/uL (ref 150.0–400.0)
RBC: 4.37 Mil/uL (ref 4.22–5.81)
RDW: 13 % (ref 11.5–15.5)
WBC: 6.3 10*3/uL (ref 4.0–10.5)

## 2021-10-10 LAB — LIPID PANEL
Cholesterol: 187 mg/dL (ref 0–200)
HDL: 46.5 mg/dL (ref >39.00)
LDL Cholesterol: 104 mg/dL — ABNORMAL HIGH (ref 0–99)
NonHDL: 140.66
Total CHOL/HDL Ratio: 4
Triglycerides: 182 mg/dL — ABNORMAL HIGH (ref 0.0–149.0)
VLDL: 36.4 mg/dL (ref 0.0–40.0)

## 2021-10-10 LAB — PSA, MEDICARE: PSA: 0.17 ng/ml (ref 0.10–4.00)

## 2021-10-10 LAB — TSH: TSH: 2.78 u[IU]/mL (ref 0.35–5.50)

## 2021-10-10 LAB — BRAIN NATRIURETIC PEPTIDE: Pro B Natriuretic peptide (BNP): 59 pg/mL (ref 0.0–100.0)

## 2021-10-10 MED ORDER — AZITHROMYCIN 250 MG PO TABS: ORAL_TABLET | ORAL | 0 refills | Status: AC

## 2021-10-10 NOTE — Assessment & Plan Note (Signed)
This is slowly getting better over the past few months but still present.  He does have some improvement with short acting beta agonist.  Discussed with patient about adding on Flovent.  See notes on labs and chest x-ray today.  Lungs are clear at time of exam.  Okay for outpatient follow-up.

## 2021-10-10 NOTE — Assessment & Plan Note (Signed)
He can try 0.5% triamcinolone cream with routine cautions and update me as needed.

## 2021-10-10 NOTE — Assessment & Plan Note (Signed)
H/o GERD generally controlled with 5mg  pepcid qd prn.  He had nocturnal regurgitation x3 nights after a late night at work and eating soon after dinner.  Dinner timing cautions d/w pt.  Discussed elevating the HOB.  Would continue Pepcid.

## 2021-10-10 NOTE — Assessment & Plan Note (Signed)
Flu encouraged. Shingles d/w pt  PNA 2019 Tetanus d/w pt.  COVID vaccine encouraged. Cologuard neg 2022 Prostate cancer screening pending. D/w pt.  Advance directive- son Marcial Pacas designated if patient were incapacitated.  HCV screen prev neg.

## 2021-10-10 NOTE — Assessment & Plan Note (Signed)
Advance directive- son Roger Lewis designated if patient were incapacitated.  

## 2021-10-11 ENCOUNTER — Other Ambulatory Visit: Payer: Self-pay | Admitting: Family Medicine

## 2021-10-11 ENCOUNTER — Encounter: Payer: Self-pay | Admitting: Family Medicine

## 2021-10-11 MED ORDER — FLUTICASONE-SALMETEROL 100-50 MCG/ACT IN AEPB: 1.0000 | INHALATION_SPRAY | Freq: Two times a day (BID) | RESPIRATORY_TRACT | 12 refills | Status: DC

## 2021-11-16 ENCOUNTER — Encounter: Payer: Self-pay | Admitting: Internal Medicine

## 2021-11-16 ENCOUNTER — Ambulatory Visit: Payer: Medicare PPO | Admitting: Internal Medicine

## 2021-11-16 ENCOUNTER — Other Ambulatory Visit
Admission: RE | Admit: 2021-11-16 | Discharge: 2021-11-16 | Disposition: A | Discharge status: Home / Self Care | Payer: Medicare PPO | Attending: Internal Medicine | Admitting: Internal Medicine

## 2021-11-16 ENCOUNTER — Other Ambulatory Visit: Payer: Self-pay

## 2021-11-16 DIAGNOSIS — J449 Chronic obstructive pulmonary disease, unspecified: Secondary | ICD-10-CM | POA: Insufficient documentation

## 2021-11-16 DIAGNOSIS — R918 Other nonspecific abnormal finding of lung field: Secondary | ICD-10-CM | POA: Diagnosis not present

## 2021-11-16 LAB — SEDIMENTATION RATE: Sed Rate: 12 mm/hr (ref 0–20)

## 2021-11-16 MED ORDER — STIOLTO RESPIMAT 2.5-2.5 MCG/ACT IN AERS: 2.5000 ug | INHALATION_SPRAY | Freq: Two times a day (BID) | RESPIRATORY_TRACT | 0 refills | Status: DC

## 2021-11-16 MED ORDER — STIOLTO RESPIMAT 2.5-2.5 MCG/ACT IN AERS: INHALATION_SPRAY | RESPIRATORY_TRACT | 0 refills | Status: DC

## 2021-11-16 NOTE — Assessment & Plan Note (Signed)
See cxr 09/21/21  ? ?I am not convinced there are significant infiltrates but his hx of worseing doe years after quit smoking is unusual and ? Early ILD would be easy to miss here so rec ? ?1) check sats at peak exercise  ? ?2) if sats trending down over time could justify HRCT and if alternatively his sats with ex are fine on stiolto would just do a screening Low dose study as he still meets criteria for the next 2 years.  ? ? ?    ?  ? ?Each maintenance medication was reviewed in detail including emphasizing most importantly the difference between maintenance and prns and under what circumstances the prns are to be triggered using an action plan format where appropriate. ? ?Total time for H and P, chart review, counseling,   and generating customized AVS unique to this office visit / same day charting = 35 min ?     ?

## 2021-11-16 NOTE — Assessment & Plan Note (Addendum)
Quit smoking 2010 with group B symptoms at initial pulmonary eval 11/16/2021  ?- 11/16/2021  After extensive coaching inhaler device,  effectiveness =    90% with [facility] so try stiolto 2 each am if insurance and bladder function permit  ?- Labs ordered 11/16/2021  :  Allergy screen Eos 0.2 IgE/ alpha one AT phenotype pending  ? ? ?Pt is Group B in terms of symptom/risk and laba/lama therefore appropriate rx at this point >>>  stiolto trial and prn saba ? ?Re SABA :  I spent extra time with pt today reviewing appropriate use of albuterol for prn use on exertion with the following points: ?1) saba is for relief of sob that does not improve by walking a slower pace or resting but rather if the pt does not improve after trying this first. ?2) If the pt is convinced, as many are, that saba helps recover from activity faster then it's easy to tell if this is the case by re-challenging : ie stop, take the inhaler, then p 5 minutes try the exact same activity (intensity of workload) that just caused the symptoms and see if they are substantially diminished or not after saba ?3) if there is an activity that reproducibly causes the symptoms, try the saba 15 min before the activity on alternate days  ? ?If in fact the saba really does help, then fine to continue to use it prn but advised may need to look closer at the maintenance regimen being used to achieve better control of airways disease with exertion.   ? ?  ?

## 2021-11-16 NOTE — Progress Notes (Signed)
? ?Roger Lewis, male    DOB: 02/13/1951,  MRN: [id number] ? ? ?Brief patient profile:  ?[age]  yowm quit smoking 2010  [place] [facility]  11/16/2021 by Dr Crawford GivensGraham Duncan  for sob > cough with onset 2019 and gradually downhill since then on bid albuterol as says could not afford advair that was rec and told previously he had mild copd at a public screening event with no record available  ? ? ? ?History of Present Illness  ?11/16/2021  Pulmonary/ 1st office eval/ [facility] [place]  [facility]  ?Chief Complaint  ?Patient presents with  ? Consult  ?Dyspnea:  better p albuterol / worse p diesel exp  ?Avg day one block and stop easier p albuterol /has oximeter but not using with ex ?Cough: not a problem / but when coughing breathing worse breathing worse  ?Sleep: ok with bed blocks  ?SABA use: 2 bid not prn ? ?No obvious day to day or daytime variability or assoc excess/ purulent sputum or mucus plugs or hemoptysis or cp or chest tightness, subjective wheeze or overt sinus or hb symptoms.  ? ?Sleeping  without nocturnal  or early am exacerbation  of respiratory  c/o's or need for noct saba. Also denies any obvious fluctuation of symptoms with weather or environmental changes or other aggravating or alleviating factors except as outlined above  ? ?No unusual exposure hx or h/o childhood pna/ asthma or knowledge of premature birth. ? ?Current Allergies, Complete Past Medical History, Past Surgical History, Family History, and Social History were reviewed in [facility] electronic medical record. ? ?ROS  The following are not active complaints unless bolded ?Hoarseness, sore throat, dysphagia, dental problems, itching, sneezing,  nasal congestion or discharge of excess mucus /watery "just like Dad" or purulent secretions, ear ache,   fever, chills, sweats, unintended wt loss or wt gain, classically pleuritic or exertional cp,  orthopnea pnd or arm/hand swelling  or leg swelling, presyncope,  palpitations, abdominal pain, anorexia, nausea, vomiting, diarrhea  or change in bowel habits or change in bladder habits, change in stools or change in urine, dysuria, hematuria,  rash, arthralgias, visual complaints, headache, numbness, weakness or ataxia or problems with walking or coordination,  change in mood or  memory. ?      ?   ? ?Past Medical History:  ?Diagnosis Date  ? GERD (gastroesophageal reflux disease)   ? Renal stones   ? ? ?Outpatient Medications Prior to Visit  ?Medication Sig Dispense Refill  ? acetaminophen (TYLENOL) 325 MG tablet Take 325 mg by mouth 3 (three) times daily as needed.    ? albuterol (VENTOLIN HFA) 108 (90 Base) MCG/ACT inhaler INHALE 1 TO 2 PUFFS BY MOUTH EVERY 6 HOURS AS NEEDED FOR SHORTNESS OF BREATH/WHEEZING 8 each 3  ? ibuprofen (ADVIL) 200 MG tablet Take 200 mg by mouth 3 (three) times daily as needed (with food.).    ? Multiple Vitamin (MULTIVITAMIN) tablet Take 1 tablet by mouth daily.    ? triamcinolone cream (KENALOG) 0.5 % Apply 1 application topically 2 (two) times daily as needed. Only use as needed on affected area 60 g 1  ?       ?     12  ?  ? ? ? ?Objective:  ?  ? ?BP 110/80 (BP Location: Left Arm, Patient Position: Sitting, Cuff Size: Normal)   Pulse 67   Temp (!) 97.5 ?F (36.4 ?C) (Oral)   Ht 5' 9.5" (1.765 m)  Wt 169 lb 3.2 oz (76.7 kg)   SpO2 95%   BMI 24.63 kg/m?  ? ?SpO2: 95 % ? ?Pleasant amb wm slt younger than stated age ? ? ?HEENT : pt wearing mask not removed for exam due to covid - 19 concerns.  ? ? ?NECK :  without JVD/Nodes/TM/ nl carotid upstrokes bilaterally ? ? ?LUNGS: no acc muscle use,  Mild barrel  contour chest wall with bilateral  Distant bs s audible wheeze and  without cough on insp or exp maneuvers  and mild  Hyperresonant  to  percussion bilaterally   ? ? ?CV:  RRR  no s3 or murmur or increase in P2, and no edema  ? ?ABD:  soft and nontender with pos end  insp Hoover's  in the supine position. No bruits or organomegaly appreciated,  bowel sounds nl ? ?MS:   Nl gait/  ext warm without deformities, calf tenderness, cyanosis or clubbing ?No obvious joint restrictions  ? ?SKIN: warm and dry without lesions   ? ?NEURO:  alert, approp, nl sensorium with  no motor or cerebellar deficits apparent.  ?    ? ? ?I personally reviewed images and agree with radiology impression as follows:  ?CXR:   Pa and lateral  10/09/21 ?Increased reticular nodule opacities, concerning for multifocal ?infection superimposed on emphysema ?My impression moderately severe copd, non-specific markings ? ? ?   ?Assessment  ? ?COPD  GOLD ?  ?Quit smoking 2010 with group B symptoms at initial pulmonary eval 11/16/2021  ?- 11/16/2021  After extensive coaching inhaler device,  effectiveness =    90% with [facility] so try stiolto 2 each am if insurance and bladder function permit  ?- Labs ordered 11/16/2021  :  Allergy screen Eos 0.2 IgE/ alpha one AT phenotype pending  ? ? ?Pt is Group B in terms of symptom/risk and laba/lama therefore appropriate rx at this point >>>  stiolto trial and prn saba ? ?Re SABA :  I spent extra time with pt today reviewing appropriate use of albuterol for prn use on exertion with the following points: ?1) saba is for relief of sob that does not improve by walking a slower pace or resting but rather if the pt does not improve after trying this first. ?2) If the pt is convinced, as many are, that saba helps recover from activity faster then it's easy to tell if this is the case by re-challenging : ie stop, take the inhaler, then p 5 minutes try the exact same activity (intensity of workload) that just caused the symptoms and see if they are substantially diminished or not after saba ?3) if there is an activity that reproducibly causes the symptoms, try the saba 15 min before the activity on alternate days  ? ?If in fact the saba really does help, then fine to continue to use it prn but advised may need to look closer at the maintenance regimen being used to achieve  better control of airways disease with exertion.   ? ?  ? ?Pulmonary infiltrates on CXR ?See cxr 09/21/21  ? ?I am not convinced there are significant infiltrates but his hx of worseing doe years after quit smoking is unusual and ? Early ILD would be easy to miss here so rec ? ?1) check sats at peak exercise  ? ?2) if sats trending down over time could justify HRCT and if alternatively his sats with ex are fine on stiolto would just do a screening Low dose  study as he still meets criteria for the next 2 years.  ? ? ?    ?  ? ?Each maintenance medication was reviewed in detail including emphasizing most importantly the difference between maintenance and prns and under what circumstances the prns are to be triggered using an action plan format where appropriate. ? ?Total time for H and P, chart review, counseling,   and generating customized AVS unique to this office visit / same day charting = 35 min ?     ? ? ? ? ?Sandrea Hughs, MD ?11/16/2021 ?    ?

## 2021-11-16 NOTE — Patient Instructions (Addendum)
Plan A = Automatic = Always =  Stiolto  2 each am  ? ?Work on inhaler technique:  relax and gently blow all the way out then take a nice smooth full deep breath back in, triggering the inhaler at same time you start breathing in.  Hold for up to 5 seconds if you can and blow out thru nose  ? ? ?Plan B = Backup (to supplement plan A, not to replace it) ?Only use your albuterol inhaler as a rescue medication to be used if you can't catch your breath by resting or doing a relaxed purse lip breathing pattern.  ?- The less you use it, the better it will work when you need it. ?- Ok to use the inhaler up to 2 puffs  every 4 hours if you must but call for appointment if use goes up over your usual need ?- Don't leave home without it !!  (think of it like the spare tire or starter fluid for your car)  ?  ?Ok to try albuterol 15 min before an activity (on alternating days)  that you know would usually make you short of breath and see if it makes any difference and if makes none then don't take albuterol after activity unless you can't catch your breath as this means it's the resting that helps, not the albuterol. ? ?Check 02 sats at peak exercise and let me know if trending down but should stay well  above 90% as goal ?    ? ?Please remember to go to the lab department  for your tests - we will call you with the results when they are available. ? ? ?We will set you up for PFTs and I will call results and go from there  ?  ?

## 2021-11-19 ENCOUNTER — Telehealth: Payer: Self-pay

## 2021-11-19 LAB — IGE: IgE (Immunoglobulin E), Serum: 9 IU/mL (ref 6–495)

## 2021-11-19 NOTE — Telephone Encounter (Signed)
Pt aware of upcoming covid test. Nothing further needed.  ?

## 2021-11-20 LAB — ALPHA-1-ANTITRYPSIN PHENOTYP: A-1 Antitrypsin, Ser: 151 mg/dL (ref 101–187)

## 2021-11-21 ENCOUNTER — Other Ambulatory Visit
Admission: RE | Admit: 2021-11-21 | Discharge: 2021-11-21 | Disposition: A | Discharge status: Home / Self Care | Payer: Medicare PPO | Source: Ambulatory Visit | Attending: Internal Medicine | Admitting: Internal Medicine

## 2021-11-21 ENCOUNTER — Encounter: Payer: Self-pay | Admitting: *Deleted

## 2021-11-21 ENCOUNTER — Other Ambulatory Visit: Payer: Self-pay

## 2021-11-21 ENCOUNTER — Telehealth: Payer: Self-pay | Admitting: Internal Medicine

## 2021-11-21 DIAGNOSIS — Z1152 Encounter for screening for COVID-19: Secondary | ICD-10-CM

## 2021-11-21 NOTE — Telephone Encounter (Signed)
Spoke to patient.  ?Patient does not wish to get covid test prior to PFT. Offered appt at [facility] office, as covid test is not required.  ?He agreed to appt. Appt scheduled 12/19/2021 at 4:00. Offered to give address and he stated that he was driving and he will obtain address from mychart. ?Nothing further needed.  ? ?Synetta Fail, please cancel PFT at [facility].  ?

## 2021-11-21 NOTE — Telephone Encounter (Signed)
Pt called to let us know that he refused covid test today. He didnt want for them to swab his nose "all the way at the back of my head." I did inform pt that that is how covid tests are administered and he argued that [facility] didn't do them like that. Placcing message back so that PFT can be cancelled.  ?

## 2021-11-22 ENCOUNTER — Ambulatory Visit: Payer: Medicare PPO

## 2021-12-05 ENCOUNTER — Encounter: Payer: Self-pay | Admitting: Internal Medicine

## 2021-12-05 NOTE — Progress Notes (Signed)
Letter mailed

## 2022-01-04 ENCOUNTER — Other Ambulatory Visit (HOSPITAL_COMMUNITY): Payer: Self-pay

## 2022-01-04 ENCOUNTER — Other Ambulatory Visit: Payer: Self-pay

## 2022-01-04 ENCOUNTER — Other Ambulatory Visit: Payer: Self-pay | Admitting: Internal Medicine

## 2022-01-04 MED ORDER — STIOLTO RESPIMAT 2.5-2.5 MCG/ACT IN AERS
INHALATION_SPRAY | RESPIRATORY_TRACT | 11 refills | Status: DC
  Filled 2022-01-04 – 2022-02-19 (×2): qty 4, 30d supply, fill #0

## 2022-02-18 ENCOUNTER — Other Ambulatory Visit: Payer: Self-pay

## 2022-02-19 ENCOUNTER — Other Ambulatory Visit: Payer: Self-pay

## 2022-02-20 ENCOUNTER — Other Ambulatory Visit: Payer: Self-pay

## 2022-02-21 ENCOUNTER — Other Ambulatory Visit: Payer: Self-pay

## 2022-02-21 ENCOUNTER — Other Ambulatory Visit (HOSPITAL_COMMUNITY): Payer: Self-pay

## 2022-02-22 ENCOUNTER — Other Ambulatory Visit: Payer: Self-pay

## 2022-02-22 ENCOUNTER — Other Ambulatory Visit (HOSPITAL_COMMUNITY): Payer: Self-pay

## 2022-02-24 IMAGING — DX DG CHEST 2V
2 series · 2 of 2 positions shown · non-contrast
Comparison: 02/24/2020

CLINICAL DATA: 70-year-old male with shortness of breath

EXAM:
CHEST - 2 VIEW

[chest pa]
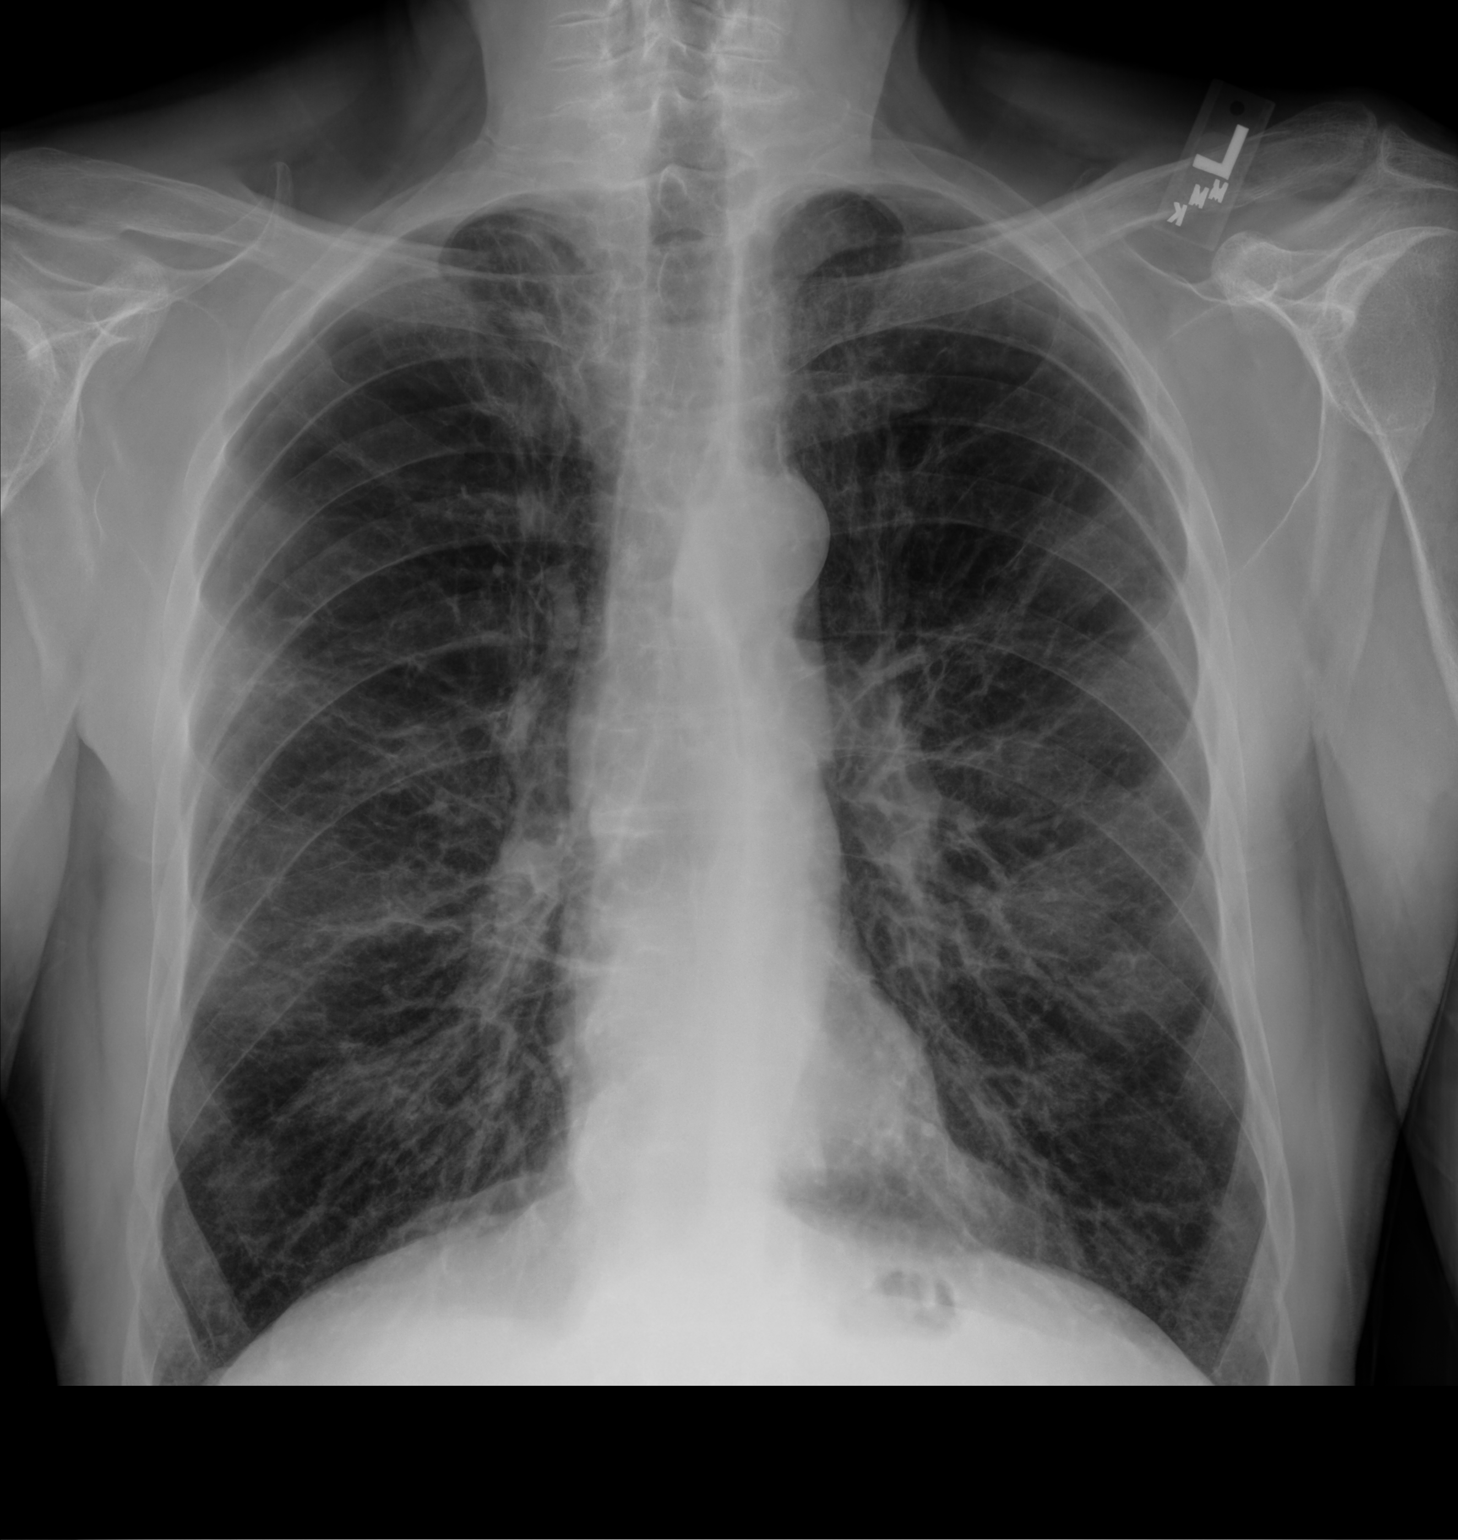

[chest lat]
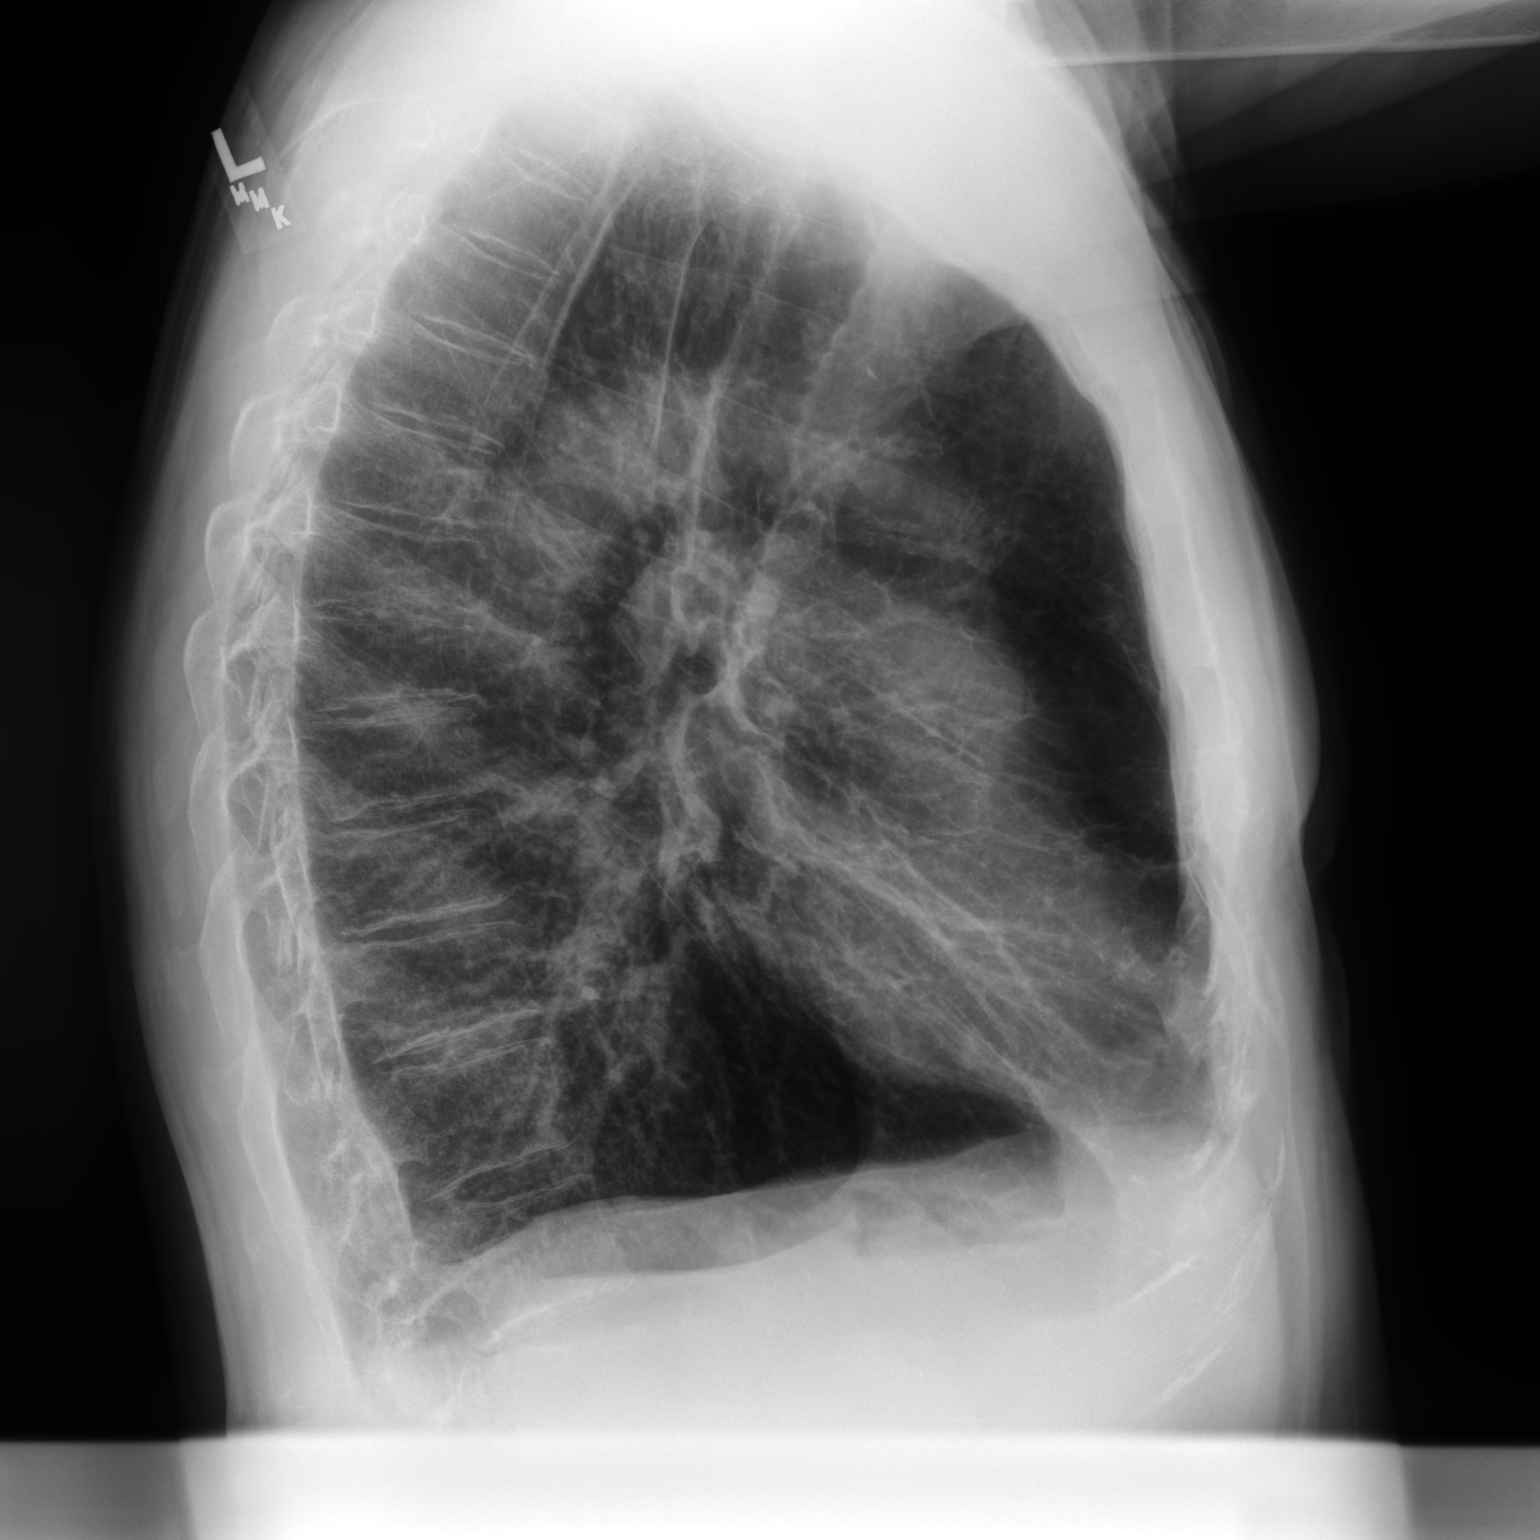

[2 of 2 positions shown; findings below may reference images not displayed]

FINDINGS: Cardiomediastinal silhouette unchanged in size and contour.

Increased reticulonodular opacities of the lungs compared to the
prior plain film.

Stigmata of emphysema, with increased retrosternal airspace,
flattened hemidiaphragms, increased AP diameter, and hyperinflation
on the AP view.

No pneumothorax.  No pleural effusion.

Degenerative changes the spine.  No acute displaced fracture
IMPRESSION: Increased reticular nodule opacities, concerning for multifocal
infection superimposed on emphysema

## 2022-02-26 ENCOUNTER — Other Ambulatory Visit (HOSPITAL_COMMUNITY): Payer: Self-pay

## 2022-02-26 MED ORDER — STIOLTO RESPIMAT 2.5-2.5 MCG/ACT IN AERS
INHALATION_SPRAY | RESPIRATORY_TRACT | 11 refills | Status: DC
  Filled 2022-02-26: qty 4, 30d supply, fill #0
  Filled 2022-04-02: qty 4, 30d supply, fill #1

## 2022-02-28 ENCOUNTER — Other Ambulatory Visit (HOSPITAL_COMMUNITY): Payer: Self-pay

## 2022-03-01 ENCOUNTER — Other Ambulatory Visit: Payer: Self-pay

## 2022-03-01 ENCOUNTER — Other Ambulatory Visit (HOSPITAL_COMMUNITY): Payer: Self-pay

## 2022-03-29 ENCOUNTER — Other Ambulatory Visit: Payer: Self-pay | Admitting: *Deleted

## 2022-03-29 MED ORDER — STIOLTO RESPIMAT 2.5-2.5 MCG/ACT IN AERS: INHALATION_SPRAY | RESPIRATORY_TRACT | 3 refills | Status: DC

## 2022-04-02 ENCOUNTER — Other Ambulatory Visit: Payer: Self-pay

## 2022-04-02 ENCOUNTER — Other Ambulatory Visit (HOSPITAL_COMMUNITY): Payer: Self-pay

## 2022-04-11 ENCOUNTER — Other Ambulatory Visit: Payer: Self-pay | Admitting: *Deleted

## 2022-04-11 MED ORDER — STIOLTO RESPIMAT 2.5-2.5 MCG/ACT IN AERS: INHALATION_SPRAY | RESPIRATORY_TRACT | 3 refills | Status: DC

## 2022-05-29 ENCOUNTER — Telehealth: Payer: Self-pay

## 2022-05-29 MED ORDER — ALBUTEROL SULFATE HFA 108 (90 BASE) MCG/ACT IN AERS: INHALATION_SPRAY | RESPIRATORY_TRACT | 1 refills | Status: DC

## 2022-05-29 NOTE — Telephone Encounter (Signed)
Patient is due for AWV part 2 with Dr. Damita Dunnings after 06/05/22. Please call to schedule.

## 2022-05-30 NOTE — Telephone Encounter (Signed)
Called patient, scheduled pt of awv for 06/24/22

## 2022-06-05 ENCOUNTER — Ambulatory Visit (INDEPENDENT_AMBULATORY_CARE_PROVIDER_SITE_OTHER): Payer: Medicare PPO

## 2022-06-05 VITALS — Wt 169.0 lb

## 2022-06-05 DIAGNOSIS — Z0000 Encounter for general adult medical examination without abnormal findings: Secondary | ICD-10-CM | POA: Diagnosis not present

## 2022-06-05 NOTE — Progress Notes (Signed)
Virtual Visit via Telephone Note  I connected with  Roger Lewis on 06/05/22 at 10:15 AM EDT by telephone and verified that I am speaking with the correct person using two identifiers.  Location: Patient: home Provider: [place] Persons participating in the virtual visit: [place]   I discussed the limitations, risks, security and privacy concerns of performing an evaluation and management service by telephone and the availability of in person appointments. The patient expressed understanding and agreed to proceed.  Interactive audio and video telecommunications were attempted between this nurse and patient, however failed, due to patient having technical difficulties OR patient did not have access to video capability.  We continued and completed visit with audio only.  Some vital signs may be absent or patient reported.   Dionisio David, LPN  Subjective:   Roger Lewis is a 71 y.o. male who presents for Medicare Annual/Subsequent preventive examination.  Review of Systems     Cardiac Risk Factors include: advanced age (>34men, >25 women);male gender     Objective:    Today's Vitals   06/05/22 1013  PainSc: 3    There is no height or weight on file to calculate BMI.     06/05/2022   10:19 AM 05/26/2021   10:18 AM  Advanced Directives  Does Patient Have a Medical Advance Directive? No No  Would patient like information on creating a medical advance directive? No - Patient declined Yes (Inpatient - patient defers creating a medical advance directive and declines information at this time)    Current Medications (verified) Outpatient Encounter Medications as of 06/05/2022  Medication Sig   albuterol (VENTOLIN HFA) 108 (90 Base) MCG/ACT inhaler INHALE 1 TO 2 PUFFS BY MOUTH EVERY 6 HOURS AS NEEDED FOR SHORTNESS OF BREATH/WHEEZING   ibuprofen (ADVIL) 200 MG tablet Take 200 mg by mouth 3 (three) times daily as needed (with food.).   Multiple  Vitamin (MULTIVITAMIN) tablet Take 1 tablet by mouth daily.   Tiotropium Bromide-Olodaterol (STIOLTO RESPIMAT) 2.5-2.5 MCG/ACT AERS Inhale 2 puffs by mouth every morning   triamcinolone cream (KENALOG) 0.5 % Apply 1 application topically 2 (two) times daily as needed. Only use as needed on affected area   acetaminophen (TYLENOL) 325 MG tablet Take 325 mg by mouth 3 (three) times daily as needed. (Patient not taking: Reported on 06/05/2022)   [DISCONTINUED] Tiotropium Bromide-Olodaterol (STIOLTO RESPIMAT) 2.5-2.5 MCG/ACT AERS Inhale 2 puffs by mouth into the lungs once a day in the morning.   No facility-administered encounter medications on file as of 06/05/2022.    Allergies (verified) Penicillins and Flexeril [cyclobenzaprine]   History: Past Medical History:  Diagnosis Date   GERD (gastroesophageal reflux disease)    Renal stones    History reviewed. No pertinent surgical history. Family History  Problem Relation Age of Onset   COPD Mother    COPD Father    Prostate cancer Paternal Uncle    Coronary artery disease Brother 17       multiple stents   Colon cancer Neg Hx    Social History   Socioeconomic History   Marital status: Single    Spouse name: Not on file   Number of children: Not on file   Years of education: Not on file   Highest education level: Not on file  Occupational History   Not on file  Tobacco Use   Smoking status: Former    Packs/day: 1.00    Years: 40.00    Total  pack years: 40.00    Types: Cigarettes    Quit date: 09/09/2008    Years since quitting: 13.7   Smokeless tobacco: Never  Substance and Sexual Activity   Alcohol use: No    Alcohol/week: 0.0 standard drinks of alcohol    Comment: Very rare   Drug use: Never   Sexual activity: Not Currently  Other Topics Concern   Not on file  Social History Narrative   Likes to [employer]- keyboard, guitar, drums   Self employed concrete work/excavations   Raises beef cattle   2 kids, 2  step kids.     Divorced.  Lives alone   From [place]   Social Determinants of Health   Financial Resource Strain: Low Risk  (06/05/2022)   Overall Financial Resource Strain (CARDIA)    Difficulty of Paying Living Expenses: Not hard at all  Food Insecurity: No Food Insecurity (06/05/2022)   Hunger Vital Sign    Worried About Running Out of Food in the Last Year: Never true    Ran Out of Food in the Last Year: Never true  Transportation Needs: No Transportation Needs (06/05/2022)   PRAPARE - Hydrologist (Medical): No    Lack of Transportation (Non-Medical): No  Physical Activity: Insufficiently Active (06/05/2022)   Exercise Vital Sign    Days of Exercise per Week: 1 day    Minutes of Exercise per Session: 60 min  Stress: No Stress Concern Present (06/05/2022)   [place]    Feeling of Stress : Only a little  Social Connections: Moderately Isolated (06/05/2022)   Social Connection and Isolation Panel [NHANES]    Frequency of Communication with Friends and Family: More than three times a week    Frequency of Social Gatherings with Friends and Family: Twice a week    Attends Religious Services: More than 4 times per year    Active Member of [employer] or Organizations: No    Attends Archivist Meetings: Never    Marital Status: Widowed    Tobacco Counseling Counseling given: Not Answered   Clinical Intake:  Pre-visit preparation completed: Yes  Pain : 0-10 Pain Score: 3  Pain Type: Acute pain Pain Location: Hand Pain Orientation: Left     Nutritional Risks: None Diabetes: No  How often do you need to have someone help you when you read instructions, pamphlets, or other written materials from your doctor or pharmacy?: 1 - Never  Diabetic?no  Interpreter Needed?: No  Information entered by :: Kirke Shaggy, LPN   Activities of Daily Living    06/05/2022   10:21 AM  06/05/2022   10:12 AM  In your present state of health, do you have any difficulty performing the following activities:  Hearing? 0 0  Vision? 0 0  Difficulty concentrating or making decisions? 0 0  Walking or climbing stairs? 0 0  Dressing or bathing? 0 0  Doing errands, shopping? 0 0  Preparing Food and eating ? N N  Using the Toilet? N N  In the past six months, have you accidently leaked urine? N N  Do you have problems with loss of bowel control? N N  Managing your Medications? N N  Managing your Finances? N N  Housekeeping or managing your Housekeeping? N N    Patient Care Team: Tonia Ghent, MD as PCP - General (Family Medicine)  Indicate any recent Medical Services you may  have received from other than [facility] providers in the past year (date may be approximate).     Assessment:   This is a routine wellness examination for [facility].  Hearing/Vision screen Hearing Screening - Comments:: Wears aid Vision Screening - Comments:: Readers- [place]  Dietary issues and exercise activities discussed: Current Exercise Habits: Home exercise routine, Type of exercise: walking, Time (Minutes): 60, Frequency (Times/Week): 1, Weekly Exercise (Minutes/Week): 60, Intensity: Mild   Goals Addressed             This Visit's Progress    DIET - EAT MORE FRUITS AND VEGETABLES         Depression Screen    06/05/2022   10:17 AM 05/26/2021   10:15 AM 03/22/2021   12:37 PM 11/17/2017    4:26 PM 11/14/2015   10:02 AM  PHQ 2/9 Scores  PHQ - 2 Score 1 0 0 0 2  PHQ- 9 Score 1  0      Fall Risk    06/05/2022   10:20 AM 05/26/2021   10:19 AM 10/06/2020   10:44 AM 11/17/2017    4:26 PM 11/14/2015   10:02 AM  Fall Risk   Falls in the past year? 0 0 0 No No  Number falls in past yr: 0 0 0    Injury with Fall? 0 0 0    Risk for fall due to : No Fall Risks      Follow up Falls prevention discussed;Falls evaluation completed Falls evaluation completed Falls evaluation completed       FALL RISK PREVENTION PERTAINING TO THE HOME:  Any stairs in or around the home? No  If so, are there any without handrails? No  Home free of loose throw rugs in walkways, pet beds, electrical cords, etc? Yes  Adequate lighting in your home to reduce risk of falls? Yes   ASSISTIVE DEVICES UTILIZED TO PREVENT FALLS:  Life alert? No  Use of a cane, walker or w/c? No  Grab bars in the bathroom? No  Shower chair or bench in shower? No  Elevated toilet seat or a handicapped toilet? No    Cognitive Function:        06/05/2022   10:22 AM 05/26/2021   10:22 AM  6CIT Screen  What Year? 0 points 0 points  What month? 0 points 0 points  What time? 0 points 0 points  Count back from 20 0 points 0 points  Months in reverse 0 points 0 points  Repeat phrase 0 points 0 points  Total Score 0 points 0 points    Immunizations Immunization History  Administered Date(s) Administered   Influenza, High Dose Seasonal PF 10/20/2017   Influenza,inj,Quad PF,6+ Mos 07/20/2016   Influenza-Unspecified 10/20/2017, 09/19/2018   Pneumococcal Conjugate-13 11/17/2017    TDAP status: Due, Education has been provided regarding the importance of this vaccine. Advised may receive this vaccine at local pharmacy or Health Dept. Aware to provide a copy of the vaccination record if obtained from local pharmacy or Health Dept. Verbalized acceptance and understanding.  Flu Vaccine status: Due, Education has been provided regarding the importance of this vaccine. Advised may receive this vaccine at local pharmacy or Health Dept. Aware to provide a copy of the vaccination record if obtained from local pharmacy or Health Dept. Verbalized acceptance and understanding.  Pneumococcal vaccine status: Due, Education has been provided regarding the importance of this vaccine. Advised may receive this vaccine at local pharmacy or Health Dept. Aware to  provide a copy of the vaccination record if obtained from local  pharmacy or Health Dept. Verbalized acceptance and understanding.  Covid-19 vaccine status: Declined, Education has been provided regarding the importance of this vaccine but patient still declined. Advised may receive this vaccine at local pharmacy or Health Dept.or vaccine clinic. Aware to provide a copy of the vaccination record if obtained from local pharmacy or Health Dept. Verbalized acceptance and understanding.  Qualifies for Shingles Vaccine? Yes   Zostavax completed No   Shingrix Completed?: No.    Education has been provided regarding the importance of this vaccine. Patient has been advised to call insurance company to determine out of pocket expense if they have not yet received this vaccine. Advised may also receive vaccine at local pharmacy or Health Dept. Verbalized acceptance and understanding.  Screening Tests Health Maintenance  Topic Date Due   COVID-19 Vaccine (1) Never done   TETANUS/TDAP  Never done   Zoster Vaccines- Shingrix (1 of 2) Never done   INFLUENZA VACCINE  04/09/2022   Pneumonia Vaccine 24+ Years old (2 - PPSV23 or PCV20) 11/17/2022 (Originally 11/18/2018)   Fecal DNA (Cologuard)  04/03/2024   Hepatitis C Screening  Completed   HPV VACCINES  Aged Out    Health Maintenance  Health Maintenance Due  Topic Date Due   COVID-19 Vaccine (1) Never done   TETANUS/TDAP  Never done   Zoster Vaccines- Shingrix (1 of 2) Never done   INFLUENZA VACCINE  04/09/2022    Colorectal cancer screening: Type of screening: Cologuard. Completed 04/08/21. Repeat every 3 years  Lung Cancer Screening: (Low Dose CT Chest recommended if Age 83-80 years, 30 pack-year currently smoking OR have quit w/in 15years.) does not qualify.   Additional Screening:  Hepatitis C Screening: does qualify; Completed 11/14/15  Vision Screening: Recommended annual ophthalmology exams for early detection of glaucoma and other disorders of the eye. Is the patient up to date with their annual eye  exam?  Yes  Who is the provider or what is the name of the office in which the patient attends annual eye exams? [place] If pt is not established with a provider, would they like to be referred to a provider to establish care? No .   Dental Screening: Recommended annual dental exams for proper oral hygiene  Community Resource Referral / Chronic Care Management: CRR required this visit?  No   CCM required this visit?  No      Plan:     I have personally reviewed and noted the following in the patient's chart:   Medical and social history Use of alcohol, tobacco or illicit drugs  Current medications and supplements including opioid prescriptions. Patient is not currently taking opioid prescriptions. Functional ability and status Nutritional status Physical activity Advanced directives List of other physicians Hospitalizations, surgeries, and ER visits in previous 12 months Vitals Screenings to include cognitive, depression, and falls Referrals and appointments  In addition, I have reviewed and discussed with patient certain preventive protocols, quality metrics, and best practice recommendations. A written personalized care plan for preventive services as well as general preventive health recommendations were provided to patient.     Dionisio David, LPN   [id number]   Nurse Notes: none

## 2022-06-05 NOTE — Patient Instructions (Signed)
Roger Lewis , Thank you for taking time to come for your Medicare Wellness Visit. I appreciate your ongoing commitment to your health goals. Please review the following plan we discussed and let me know if I can assist you in the future.   Screening recommendations/referrals: Colonoscopy: Cologuard 04/08/21 Recommended yearly ophthalmology/optometry visit for glaucoma screening and checkup Recommended yearly dental visit for hygiene and checkup  Vaccinations: Influenza vaccine: n/d Pneumococcal vaccine: 11/17/17 Tdap vaccine: n/d Shingles vaccine: n/d   Covid-19: n/d  Advanced directives: no  Conditions/risks identified: none  Next appointment: Follow up in one year for your annual wellness visit. 06/09/23 @ 10 am by phone  Preventive Care 65 Years and Older, Male Preventive care refers to lifestyle choices and visits with your health care provider that can promote health and wellness. What does preventive care include? A yearly physical exam. This is also called an annual well check. Dental exams once or twice a year. Routine eye exams. Ask your health care provider how often you should have your eyes checked. Personal lifestyle choices, including: Daily care of your teeth and gums. Regular physical activity. Eating a healthy diet. Avoiding tobacco and drug use. Limiting alcohol use. Practicing safe sex. Taking low doses of aspirin every day. Taking vitamin and mineral supplements as recommended by your health care provider. What happens during an annual well check? The services and screenings done by your health care provider during your annual well check will depend on your age, overall health, lifestyle risk factors, and family history of disease. Counseling  Your health care provider may ask you questions about your: Alcohol use. Tobacco use. Drug use. Emotional well-being. Home and relationship well-being. Sexual activity. Eating habits. History of falls. Memory and  ability to understand (cognition). Work and work Statistician. Screening  You may have the following tests or measurements: Height, weight, and BMI. Blood pressure. Lipid and cholesterol levels. These may be checked every 5 years, or more frequently if you are over 72 years old. Skin check. Lung cancer screening. You may have this screening every year starting at age 69 if you have a 30-pack-year history of smoking and currently smoke or have quit within the past 15 years. Fecal occult blood test (FOBT) of the stool. You may have this test every year starting at age 64. Flexible sigmoidoscopy or colonoscopy. You may have a sigmoidoscopy every 5 years or a colonoscopy every 10 years starting at age 42. Prostate cancer screening. Recommendations will vary depending on your family history and other risks. Hepatitis C blood test. Hepatitis B blood test. Sexually transmitted disease (STD) testing. Diabetes screening. This is done by checking your blood sugar (glucose) after you have not eaten for a while (fasting). You may have this done every 1-3 years. Abdominal aortic aneurysm (AAA) screening. You may need this if you are a current or former smoker. Osteoporosis. You may be screened starting at age 75 if you are at high risk. Talk with your health care provider about your test results, treatment options, and if necessary, the need for more tests. Vaccines  Your health care provider may recommend certain vaccines, such as: Influenza vaccine. This is recommended every year. Tetanus, diphtheria, and acellular pertussis (Tdap, Td) vaccine. You may need a Td booster every 10 years. Zoster vaccine. You may need this after age 64. Pneumococcal 13-valent conjugate (PCV13) vaccine. One dose is recommended after age 58. Pneumococcal polysaccharide (PPSV23) vaccine. One dose is recommended after age 27. Talk to your health care provider  about which screenings and vaccines you need and how often you need  them. This information is not intended to replace advice given to you by your health care provider. Make sure you discuss any questions you have with your health care provider. Document Released: 09/22/2015 Document Revised: 05/15/2016 Document Reviewed: 06/27/2015 Elsevier Interactive Patient Education  2017 [place] Prevention in the Home Falls can cause injuries. They can happen to people of all ages. There are many things you can do to make your home safe and to help prevent falls. What can I do on the outside of my home? Regularly fix the edges of walkways and driveways and fix any cracks. Remove anything that might make you trip as you walk through a door, such as a raised step or threshold. Trim any bushes or trees on the path to your home. Use bright outdoor lighting. Clear any walking paths of anything that might make someone trip, such as rocks or tools. Regularly check to see if handrails are loose or broken. Make sure that both sides of any steps have handrails. Any raised decks and porches should have guardrails on the edges. Have any leaves, snow, or ice cleared regularly. Use sand or salt on walking paths during winter. Clean up any spills in your garage right away. This includes oil or grease spills. What can I do in the bathroom? Use night lights. Install grab bars by the toilet and in the tub and shower. Do not use towel bars as grab bars. Use non-skid mats or decals in the tub or shower. If you need to sit down in the shower, use a plastic, non-slip stool. Keep the floor dry. Clean up any water that spills on the floor as soon as it happens. Remove soap buildup in the tub or shower regularly. Attach bath mats securely with double-sided non-slip rug tape. Do not have throw rugs and other things on the floor that can make you trip. What can I do in the bedroom? Use night lights. Make sure that you have a light by your bed that is easy to reach. Do not use  any sheets or blankets that are too big for your bed. They should not hang down onto the floor. Have a firm chair that has side arms. You can use this for support while you get dressed. Do not have throw rugs and other things on the floor that can make you trip. What can I do in the kitchen? Clean up any spills right away. Avoid walking on wet floors. Keep items that you use a lot in easy-to-reach places. If you need to reach something above you, use a strong step stool that has a grab bar. Keep electrical cords out of the way. Do not use floor polish or wax that makes floors slippery. If you must use wax, use non-skid floor wax. Do not have throw rugs and other things on the floor that can make you trip. What can I do with my stairs? Do not leave any items on the stairs. Make sure that there are handrails on both sides of the stairs and use them. Fix handrails that are broken or loose. Make sure that handrails are as long as the stairways. Check any carpeting to make sure that it is firmly attached to the stairs. Fix any carpet that is loose or worn. Avoid having throw rugs at the top or bottom of the stairs. If you do have throw rugs, attach them to the floor  with carpet tape. Make sure that you have a light switch at the top of the stairs and the bottom of the stairs. If you do not have them, ask someone to add them for you. What else can I do to help prevent falls? Wear shoes that: Do not have high heels. Have rubber bottoms. Are comfortable and fit you well. Are closed at the toe. Do not wear sandals. If you use a stepladder: Make sure that it is fully opened. Do not climb a closed stepladder. Make sure that both sides of the stepladder are locked into place. Ask someone to hold it for you, if possible. Clearly mark and make sure that you can see: Any grab bars or handrails. First and last steps. Where the edge of each step is. Use tools that help you move around (mobility aids)  if they are needed. These include: Canes. Walkers. Scooters. Crutches. Turn on the lights when you go into a dark area. Replace any light bulbs as soon as they burn out. Set up your furniture so you have a clear path. Avoid moving your furniture around. If any of your floors are uneven, fix them. If there are any pets around you, be aware of where they are. Review your medicines with your doctor. Some medicines can make you feel dizzy. This can increase your chance of falling. Ask your doctor what other things that you can do to help prevent falls. This information is not intended to replace advice given to you by your health care provider. Make sure you discuss any questions you have with your health care provider. Document Released: 06/22/2009 Document Revised: 02/01/2016 Document Reviewed: 09/30/2014 Elsevier Interactive Patient Education  2017 [employer].

## 2022-06-09 ENCOUNTER — Other Ambulatory Visit: Payer: Self-pay | Admitting: Family Medicine

## 2022-06-09 DIAGNOSIS — Z125 Encounter for screening for malignant neoplasm of prostate: Secondary | ICD-10-CM

## 2022-06-09 DIAGNOSIS — E785 Hyperlipidemia, unspecified: Secondary | ICD-10-CM

## 2022-06-17 ENCOUNTER — Other Ambulatory Visit (INDEPENDENT_AMBULATORY_CARE_PROVIDER_SITE_OTHER): Payer: Medicare PPO

## 2022-06-17 DIAGNOSIS — Z125 Encounter for screening for malignant neoplasm of prostate: Secondary | ICD-10-CM

## 2022-06-17 DIAGNOSIS — E785 Hyperlipidemia, unspecified: Secondary | ICD-10-CM | POA: Diagnosis not present

## 2022-06-17 LAB — PSA, MEDICARE: PSA: 0.22 ng/ml (ref 0.10–4.00)

## 2022-06-17 LAB — COMPREHENSIVE METABOLIC PANEL
ALT: 13 U/L (ref 0–53)
AST: 17 U/L (ref 0–37)
Albumin: 4.2 g/dL (ref 3.5–5.2)
Alkaline Phosphatase: 70 U/L (ref 39–117)
BUN: 15 mg/dL (ref 6–23)
CO2: 31 mEq/L (ref 19–32)
Calcium: 9.7 mg/dL (ref 8.4–10.5)
Chloride: 103 mEq/L (ref 96–112)
Creatinine, Ser: 0.8 mg/dL (ref 0.40–1.50)
GFR: 88.58 mL/min (ref >60.00)
Glucose, Bld: 97 mg/dL (ref 70–99)
Potassium: 4.3 mEq/L (ref 3.5–5.1)
Sodium: 140 mEq/L (ref 135–145)
Total Bilirubin: 0.5 mg/dL (ref 0.2–1.2)
Total Protein: 7 g/dL (ref 6.0–8.3)

## 2022-06-17 LAB — LIPID PANEL
Cholesterol: 182 mg/dL (ref 0–200)
HDL: 46.9 mg/dL (ref >39.00)
LDL Cholesterol: 114 mg/dL — ABNORMAL HIGH (ref 0–99)
NonHDL: 134.86
Total CHOL/HDL Ratio: 4
Triglycerides: 103 mg/dL (ref 0.0–149.0)
VLDL: 20.6 mg/dL (ref 0.0–40.0)

## 2022-06-24 ENCOUNTER — Encounter: Payer: Self-pay | Admitting: Family Medicine

## 2022-06-24 ENCOUNTER — Ambulatory Visit (INDEPENDENT_AMBULATORY_CARE_PROVIDER_SITE_OTHER): Payer: Medicare PPO | Admitting: Family Medicine

## 2022-06-24 VITALS — BP 104/62 | HR 58 | Temp 97.3°F | Ht 69.5 in | Wt 170.0 lb

## 2022-06-24 DIAGNOSIS — J449 Chronic obstructive pulmonary disease, unspecified: Secondary | ICD-10-CM | POA: Diagnosis not present

## 2022-06-24 DIAGNOSIS — M6530 Trigger finger, unspecified finger: Secondary | ICD-10-CM | POA: Diagnosis not present

## 2022-06-24 DIAGNOSIS — M545 Low back pain, unspecified: Secondary | ICD-10-CM

## 2022-06-24 DIAGNOSIS — R21 Rash and other nonspecific skin eruption: Secondary | ICD-10-CM

## 2022-06-24 DIAGNOSIS — Z7189 Other specified counseling: Secondary | ICD-10-CM

## 2022-06-24 DIAGNOSIS — Z0000 Encounter for general adult medical examination without abnormal findings: Secondary | ICD-10-CM

## 2022-06-24 MED ORDER — ALBUTEROL SULFATE HFA 108 (90 BASE) MCG/ACT IN AERS: INHALATION_SPRAY | RESPIRATORY_TRACT | 1 refills | Status: DC

## 2022-06-24 MED ORDER — DICLOFENAC SODIUM 1 % EX GEL
2.0000 g | Freq: Four times a day (QID) | CUTANEOUS | 2 refills | Status: DC
Start: 2022-06-24 — End: 2023-03-06

## 2022-06-24 MED ORDER — TRIAMCINOLONE ACETONIDE 0.5 % EX CREA: 1.0000 | TOPICAL_CREAM | Freq: Two times a day (BID) | CUTANEOUS | 4 refills | Status: DC | PRN

## 2022-06-24 MED ORDER — FLUTICASONE PROPIONATE 50 MCG/ACT NA SUSP: 2.0000 | Freq: Every day | NASAL | 6 refills | Status: DC | PRN

## 2022-06-24 NOTE — Progress Notes (Unsigned)
COPD.  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 Stioloto helped.  Using albuterol 2 puffs in the evening but not every evening.  D/w pt masking to limit exposure and using flonase as needed. D/w pt about nasal saline.  Scant sputum occ.    Occ nonradicular R lower back pain.  Not daily.  Not midline pain.  No left-sided pain.  Skin irritation, using TAC cream prn.    H/o R 3rd finger fracture in childhood, with triggering now.  Discussed options.  See plan.  Flu encouraged. Shingles d/w pt  PNA 2019 Tetanus d/w pt.  COVID vaccine encouraged. Cologuard neg 2022 PSA wnl 2023.   Advance directive- son Christia Reading designated if patient were incapacitated.  HCV screen prev neg.  A pipe broke in his house and it was a mess to clean up.  D/w pt.    Meds, vitals, and allergies reviewed.   ROS: Per HPI unless specifically indicated in ROS section   GEN: nad, alert and oriented HEENT: mucous membranes moist NECK: supple w/o LA CV: rrr.  no murmur PULM: ctab, no inc wob ABD: soft, +bs EXT: no edema SKIN: no acute rash but chronic dry skin noted on the hands. Right third finger triggering with chronic IP joint changes. R SLR neg.   Midline back nontender.  No CVA pain.  30 minutes were devoted to patient care in this encounter (this includes time spent reviewing the patient's file/history, interviewing and examining the patient, counseling/reviewing plan with patient).

## 2022-06-24 NOTE — Patient Instructions (Signed)
Use voltaren gel on your hands.  If not better, then ask about seeing Dr. Lorelei Pont.  Take care.  Glad to see you.

## 2022-06-26 DIAGNOSIS — M6530 Trigger finger, unspecified finger: Secondary | ICD-10-CM | POA: Insufficient documentation

## 2022-06-26 NOTE — Assessment & Plan Note (Signed)
He has arthritic changes, so if he does not improve with Voltaren gel he did ask about seeing Dr. Lorelei Pont for consideration of injection.

## 2022-06-26 NOTE — Assessment & Plan Note (Signed)
Advance directive- son Christia Reading designated if patient were incapacitated.

## 2022-06-26 NOTE — Assessment & Plan Note (Signed)
Not typical for sciatica.  He can update me as needed.

## 2022-06-26 NOTE — Assessment & Plan Note (Signed)
Discussed routine vaccination.  No change in inhalers at this point.  Doing well.  Continue.  Albuterol use with scheduled Stiolto.  Discussed using Flonase in case that was contributing to his symptoms at all.  Discussed masking to limit particulate exposure when he is working in the yard.  He will update me as needed.

## 2022-06-26 NOTE — Assessment & Plan Note (Signed)
Continue triamcinolone cream as needed 

## 2022-06-26 NOTE — Assessment & Plan Note (Signed)
Flu encouraged. Shingles d/w pt  PNA 2019 Tetanus d/w pt.  COVID vaccine encouraged. Cologuard neg 2022 PSA wnl 2023.   Advance directive- son Christia Reading designated if patient were incapacitated.  HCV screen prev neg.

## 2022-10-09 ENCOUNTER — Other Ambulatory Visit: Payer: Self-pay

## 2022-10-09 MED ORDER — FLUTICASONE PROPIONATE 50 MCG/ACT NA SUSP: 2.0000 | Freq: Every day | NASAL | 3 refills | Status: AC | PRN

## 2023-02-19 ENCOUNTER — Other Ambulatory Visit: Payer: Self-pay | Admitting: Family Medicine

## 2023-02-28 ENCOUNTER — Other Ambulatory Visit: Payer: Self-pay | Admitting: Family Medicine

## 2023-03-03 NOTE — Telephone Encounter (Signed)
Refill request for albuterol (VENTOLIN HFA) 108 (90 Base) MCG/ACT inhaler   LOV - 06/24/22 Next OV - 03/06/23 Last refill - 02/19/23 #6.7 g/0

## 2023-03-05 DIAGNOSIS — H52223 Regular astigmatism, bilateral: Secondary | ICD-10-CM | POA: Diagnosis not present

## 2023-03-05 DIAGNOSIS — H524 Presbyopia: Secondary | ICD-10-CM | POA: Diagnosis not present

## 2023-03-05 DIAGNOSIS — H2513 Age-related nuclear cataract, bilateral: Secondary | ICD-10-CM | POA: Diagnosis not present

## 2023-03-05 DIAGNOSIS — H5203 Hypermetropia, bilateral: Secondary | ICD-10-CM | POA: Diagnosis not present

## 2023-03-06 ENCOUNTER — Encounter: Payer: Self-pay | Admitting: Family Medicine

## 2023-03-06 ENCOUNTER — Ambulatory Visit (INDEPENDENT_AMBULATORY_CARE_PROVIDER_SITE_OTHER): Payer: Medicare Other | Admitting: Family Medicine

## 2023-03-06 VITALS — BP 122/70 | HR 66 | Temp 97.1°F | Ht 69.5 in | Wt 154.0 lb

## 2023-03-06 DIAGNOSIS — J441 Chronic obstructive pulmonary disease with (acute) exacerbation: Secondary | ICD-10-CM | POA: Diagnosis not present

## 2023-03-06 MED ORDER — PREDNISONE 20 MG PO TABS: ORAL_TABLET | ORAL | 0 refills | Status: DC

## 2023-03-06 MED ORDER — DOXYCYCLINE HYCLATE 100 MG PO TABS: 100.0000 mg | ORAL_TABLET | Freq: Two times a day (BID) | ORAL | 0 refills | Status: DC

## 2023-03-06 NOTE — Assessment & Plan Note (Signed)
Still okay for outpatient f/u.  Prednisone, doxy, continue inhalers.  See AVS.  D/w pt. Would defer tick related testing since he is going to start doxy.  D/w pt and he agrees.  Update [place] as needed.  Unclear how much environmental exposures contributed.

## 2023-03-06 NOTE — Patient Instructions (Addendum)
Start prednisone and doxy.  Update me as needed.  Keep using stiolto at baseline with albuterol if needed.  Take care.  Glad to see you. Update [place] as needed.

## 2023-03-06 NOTE — Progress Notes (Signed)
Cough and chest congestion for 2 weeks. Did not do a home covid test. He has been taking Robitussin cough and chest congestion. Sputum is brown, that is a change for patient.  He had more SOB a few days ago.  Prev with fever up to 100. Wheeze at night with illness, but not usually.  Throat irritation after gargling with salt water.    He painted a driveway a few days prior to the sx starting.  D/w pt about fume exposure at the time.  He also had to deal with a leaking fuel tank- he was wearing a respirator with that.    Still on stioloto with prn SABA.  More use of SABA with illness, rare use o/w.    He has noted less tolerance for temperature variation, ie cold feet at night.    He had mult tick bites this year.  He checks daily.  Cautions d/w pt.    Meds, vitals, and allergies reviewed.   ROS: Per HPI unless specifically indicated in ROS section   Nad Ncat TM wnl B OP wnl Neck supple, no LA Diffuse exp wheeze w/o focal dec in BS.  Rrr Abd soft.  Ext well perfused.  Normal DP pulses B with normal cap refill.

## 2023-03-10 ENCOUNTER — Ambulatory Visit: Payer: Medicare Other | Admitting: Family Medicine

## 2023-04-02 ENCOUNTER — Encounter: Payer: Self-pay | Admitting: Family Medicine

## 2023-04-04 ENCOUNTER — Telehealth: Payer: Medicare Other | Admitting: Family Medicine

## 2023-04-11 ENCOUNTER — Emergency Department (HOSPITAL_BASED_OUTPATIENT_CLINIC_OR_DEPARTMENT_OTHER): Payer: Medicare Other

## 2023-04-11 ENCOUNTER — Other Ambulatory Visit: Payer: Self-pay

## 2023-04-11 ENCOUNTER — Telehealth: Payer: Self-pay | Admitting: Family Medicine

## 2023-04-11 ENCOUNTER — Encounter (HOSPITAL_BASED_OUTPATIENT_CLINIC_OR_DEPARTMENT_OTHER): Payer: Self-pay

## 2023-04-11 ENCOUNTER — Emergency Department (HOSPITAL_BASED_OUTPATIENT_CLINIC_OR_DEPARTMENT_OTHER)
Admission: EM | Admit: 2023-04-11 | Discharge: 2023-04-11 | Disposition: A | Discharge status: Home / Self Care | Payer: Medicare Other | Source: Home / Self Care | Attending: Emergency Medicine | Admitting: Emergency Medicine

## 2023-04-11 ENCOUNTER — Ambulatory Visit
Admission: RE | Admit: 2023-04-11 | Discharge: 2023-04-11 | Disposition: A | Discharge status: Home / Self Care | Payer: Medicare Other | Source: Ambulatory Visit | Attending: Physician Assistant | Admitting: Physician Assistant

## 2023-04-11 VITALS — BP 122/78 | HR 68 | Temp 97.6°F | Resp 16

## 2023-04-11 DIAGNOSIS — Z87891 Personal history of nicotine dependence: Secondary | ICD-10-CM | POA: Diagnosis not present

## 2023-04-11 DIAGNOSIS — N200 Calculus of kidney: Secondary | ICD-10-CM | POA: Diagnosis not present

## 2023-04-11 DIAGNOSIS — R310 Gross hematuria: Secondary | ICD-10-CM

## 2023-04-11 DIAGNOSIS — J449 Chronic obstructive pulmonary disease, unspecified: Secondary | ICD-10-CM | POA: Diagnosis not present

## 2023-04-11 DIAGNOSIS — R319 Hematuria, unspecified: Secondary | ICD-10-CM | POA: Diagnosis not present

## 2023-04-11 DIAGNOSIS — N210 Calculus in bladder: Secondary | ICD-10-CM

## 2023-04-11 DIAGNOSIS — K5730 Diverticulosis of large intestine without perforation or abscess without bleeding: Secondary | ICD-10-CM | POA: Diagnosis not present

## 2023-04-11 DIAGNOSIS — K4090 Unilateral inguinal hernia, without obstruction or gangrene, not specified as recurrent: Secondary | ICD-10-CM | POA: Diagnosis not present

## 2023-04-11 LAB — CBC WITH DIFFERENTIAL/PLATELET
Abs Immature Granulocytes: 0.03 10*3/uL (ref 0.00–0.07)
Basophils Absolute: 0.1 10*3/uL (ref 0.0–0.1)
Basophils Relative: 1 %
Eosinophils Absolute: 0.2 10*3/uL (ref 0.0–0.5)
Eosinophils Relative: 3 %
HCT: 39.4 % (ref 39.0–52.0)
Hemoglobin: 12.8 g/dL — ABNORMAL LOW (ref 13.0–17.0)
Immature Granulocytes: 0 %
Lymphocytes Relative: 24 %
Lymphs Abs: 1.8 10*3/uL (ref 0.7–4.0)
MCH: 29.2 pg (ref 26.0–34.0)
MCHC: 32.5 g/dL (ref 30.0–36.0)
MCV: 89.7 fL (ref 80.0–100.0)
Monocytes Absolute: 0.8 10*3/uL (ref 0.1–1.0)
Monocytes Relative: 12 %
Neutro Abs: 4.3 10*3/uL (ref 1.7–7.7)
Neutrophils Relative %: 60 %
Platelets: 254 10*3/uL (ref 150–400)
RBC: 4.39 MIL/uL (ref 4.22–5.81)
RDW: 13.2 % (ref 11.5–15.5)
WBC: 7.3 10*3/uL (ref 4.0–10.5)
nRBC: 0 % (ref 0.0–0.2)

## 2023-04-11 LAB — URINALYSIS, ROUTINE W REFLEX MICROSCOPIC
Bacteria, UA: NONE SEEN
Bilirubin Urine: NEGATIVE
Glucose, UA: NEGATIVE mg/dL
Ketones, ur: NEGATIVE mg/dL
Leukocytes,Ua: NEGATIVE
Nitrite: NEGATIVE
RBC / HPF: >50 RBC/hpf (ref 0–5)
Specific Gravity, Urine: 1.019 (ref 1.005–1.030)
pH: 7 (ref 5.0–8.0)

## 2023-04-11 LAB — COMPREHENSIVE METABOLIC PANEL
ALT: 12 U/L (ref 0–44)
AST: 17 U/L (ref 15–41)
Albumin: 4.4 g/dL (ref 3.5–5.0)
Alkaline Phosphatase: 70 U/L (ref 38–126)
Anion gap: 7 (ref 5–15)
BUN: 21 mg/dL (ref 8–23)
CO2: 28 mmol/L (ref 22–32)
Calcium: 10 mg/dL (ref 8.9–10.3)
Chloride: 103 mmol/L (ref 98–111)
Creatinine, Ser: 0.75 mg/dL (ref 0.61–1.24)
GFR, Estimated: >60 mL/min (ref >60)
Glucose, Bld: 97 mg/dL (ref 70–99)
Potassium: 5.2 mmol/L — ABNORMAL HIGH (ref 3.5–5.1)
Sodium: 138 mmol/L (ref 135–145)
Total Bilirubin: 0.6 mg/dL (ref 0.3–1.2)
Total Protein: 7.1 g/dL (ref 6.5–8.1)

## 2023-04-11 LAB — POCT URINALYSIS DIP (MANUAL ENTRY)
Bilirubin, UA: NEGATIVE
Glucose, UA: NEGATIVE mg/dL
Ketones, POC UA: NEGATIVE mg/dL
Leukocytes, UA: NEGATIVE
Nitrite, UA: NEGATIVE
Protein Ur, POC: 30 mg/dL — AB
Spec Grav, UA: >=1.03 — AB (ref 1.010–1.025)
Urobilinogen, UA: 0.2 E.U./dL
pH, UA: 6 (ref 5.0–8.0)

## 2023-04-11 MED ORDER — TAMSULOSIN HCL 0.4 MG PO CAPS: 0.4000 mg | ORAL_CAPSULE | Freq: Every day | ORAL | 3 refills | Status: DC

## 2023-04-11 NOTE — Telephone Encounter (Signed)
Noted. Agree with recommendations.  

## 2023-04-11 NOTE — ED Triage Notes (Signed)
Pt to ED from urgent care c/o blood in urine x 3 days.

## 2023-04-11 NOTE — ED Notes (Signed)
Patient is being discharged from the Urgent Care and sent to the Emergency Department via private vehicle . Per R. Izola Price PA, patient is in need of higher level of care due to hematuria. Patient is aware and verbalizes understanding of plan of care.  Vitals:   04/11/23 1504  BP: 122/78  Pulse: 68  Resp: 16  Temp: 97.6 F (36.4 C)  SpO2: 93%

## 2023-04-11 NOTE — ED Provider Notes (Signed)
[place] EMERGENCY DEPARTMENT AT [facility] Provider Note   CSN: [id number] Arrival date & time: 04/11/23  1604     History  Chief Complaint  Patient presents with   Hematuria    Roger Lewis is a 73 y.o. male.  Patient here with some blood in his urine the last few days.  Maybe some right lower back pain.  Sound like he might have a history of kidney stones.  He states that urine is actually more clear today than the last few days.  Denies any chest pain or shortness of breath.  He is not having any issues emptying his bladder but he is having some urinary frequency.  He is not having any pain with bowel movements.  Denies any nausea vomiting.  Pain is well-controlled at this time.  Sounds like he went to urgent care who sent him here.  Sounds like they sent him in a prescription for Flomax.  The history is provided by the patient.       Home Medications Prior to Admission medications   Medication Sig Start Date End Date Taking? Authorizing Provider  acetaminophen (TYLENOL) 325 MG tablet Take 325 mg by mouth 3 (three) times daily as needed.    [provider]  albuterol (VENTOLIN HFA) 108 (90 Base) MCG/ACT inhaler USE 1 TO 2 INHALATIONS BY MOUTH  EVERY 6 HOURS AS NEEDED FOR  SHORTNESS OF BREATH OR WHEEZING 03/03/23   Joaquim Nam, MD  doxycycline (VIBRA-TABS) 100 MG tablet Take 1 tablet (100 mg total) by mouth 2 (two) times daily. 03/06/23   Joaquim Nam, MD  fluticasone Aleda Grana) 50 MCG/ACT nasal spray Place 2 sprays into both nostrils daily as needed for allergies or rhinitis. 10/09/22   Joaquim Nam, MD  ibuprofen (ADVIL) 200 MG tablet Take 200 mg by mouth 3 (three) times daily as needed (with food.).    [provider]  Multiple Vitamin (MULTIVITAMIN) tablet Take 1 tablet by mouth daily.    [provider]  predniSONE (DELTASONE) 20 MG tablet Take 2 a day for 5 days, then 1 a day for 5 days, with food. Don't take with  aleve/ibuprofen. 03/06/23   Joaquim Nam, MD  tamsulosin (FLOMAX) 0.4 MG CAPS capsule Take 1 capsule (0.4 mg total) by mouth daily. 04/11/23   Joaquim Nam, MD  Tiotropium Bromide-Olodaterol (STIOLTO RESPIMAT) 2.5-2.5 MCG/ACT AERS Inhale 2 puffs by mouth every morning 01/04/22   Nyoka Cowden, MD  triamcinolone cream (KENALOG) 0.5 % Apply 1 Application topically 2 (two) times daily as needed. Only use as needed on affected area 06/24/22   Joaquim Nam, MD      Allergies    Penicillins and Flexeril [cyclobenzaprine]    Review of Systems   Review of Systems  Physical Exam Updated Vital Signs BP (!) 128/108 (BP Location: Right Arm)   Pulse 67   Temp 98.3 F (36.8 C)   Resp 17   Ht 5\' 9"  (1.753 m)   Wt 69.4 kg   SpO2 95%   BMI 22.59 kg/m  Physical Exam Vitals and nursing note reviewed.  Constitutional:      General: He is not in acute distress.    Appearance: He is well-developed.  HENT:     Head: Normocephalic and atraumatic.  Eyes:     Extraocular Movements: Extraocular movements intact.     Conjunctiva/sclera: Conjunctivae normal.     Pupils: Pupils are equal, round, and reactive to light.  Cardiovascular:     Rate and Rhythm: Normal rate and regular rhythm.     Heart sounds: No murmur heard. Pulmonary:     Effort: Pulmonary effort is normal. No respiratory distress.     Breath sounds: Normal breath sounds.  Abdominal:     Palpations: Abdomen is soft.     Tenderness: There is no abdominal tenderness. There is right CVA tenderness.  Musculoskeletal:        General: No swelling.     Cervical back: Neck supple.  Skin:    General: Skin is warm and dry.     Capillary Refill: Capillary refill takes less than 2 seconds.  Neurological:     General: No focal deficit present.     Mental Status: He is alert.  Psychiatric:        Mood and Affect: Mood normal.     ED Results / Procedures / Treatments   Labs (all labs ordered are listed, but only abnormal  results are displayed) Labs Reviewed  URINALYSIS, ROUTINE W REFLEX MICROSCOPIC - Abnormal; Notable for the following components:      Result Value   APPearance HAZY (*)    Hgb urine dipstick LARGE (*)    Protein, ur TRACE (*)    All other components within normal limits  CBC WITH DIFFERENTIAL/PLATELET - Abnormal; Notable for the following components:   Hemoglobin 12.8 (*)    All other components within normal limits  COMPREHENSIVE METABOLIC PANEL - Abnormal; Notable for the following components:   Potassium 5.2 (*)    All other components within normal limits  URINE CULTURE    EKG None  Radiology CT Renal Stone Study  Result Date: 04/11/2023 CLINICAL DATA:  Flank pain. EXAM: CT ABDOMEN AND PELVIS WITHOUT CONTRAST TECHNIQUE: Multidetector CT imaging of the abdomen and pelvis was performed following the standard protocol without IV contrast. RADIATION DOSE REDUCTION: This exam was performed according to the departmental dose-optimization program which includes automated exposure control, adjustment of the mA and/or kV according to patient size and/or use of iterative reconstruction technique. COMPARISON:  CT 2008. FINDINGS: Lower chest: Emphysematous lung changes along the lung bases. No pleural effusion. Hepatobiliary: On this non IV contrast exam, grossly preserved hepatic parenchyma. Gallbladder is nondilated. Pancreas: Unremarkable. No pancreatic ductal dilatation or surrounding inflammatory changes. Spleen: Spleen is enlarged cephalocaudal length of 15 cm. Adrenals/Urinary Tract: Adrenal glands are preserved. No collecting system dilatation of either kidney. There is a punctate nonobstructing upper pole right-sided renal stone. Vascular calcifications along the right renal hilum. No ureteral stones are identified. Preserved contours of the urinary bladder with a dependent bladder stone measuring 10 mm. Stomach/Bowel: On this non oral contrast exam, the large bowel has a normal course and  caliber. Sigmoid colon diverticula identified. Scattered colonic stool. Atypical distribution of bowel with loops of colon extending between the liver margin in the anterolateral abdominal wall. Normal appendix in this location. The stomach is nondilated. Small bowel is nondilated. Vascular/Lymphatic: Moderate vascular calcifications. Aorta and iliac calcifications. Normal caliber aorta and IVC. No discrete abnormal lymph node enlargement identified in the abdomen and pelvis. Reproductive: Prostate is unremarkable. Other: Mild anasarca.  Small fat containing right inguinal hernia. Musculoskeletal: Trace anterolisthesis of L3 on L4. Retrolisthesis of L5 on S1. Scattered degenerative changes seen of the spine and pelvis. IMPRESSION: Punctate nonobstructing right-sided renal stone. No ureteral stones. 1 cm bladder stone. Colonic diverticula.  Normal appendix.  Scattered stool. Emphysematous lung changes along the lung bases. Mild splenic enlargement.  Electronically Signed   By: Karen Kays M.D.   On: 04/11/2023 17:39    Procedures Procedures    Medications Ordered in ED Medications - No data to display  ED Course/ Medical Decision Making/ A&P                                 Medical Decision Making Amount and/or Complexity of Data Reviewed Labs: ordered. Radiology: ordered.   Roger Lewis is here with blood in the urine.  Normal vitals.  No fever.  Seems like he has a history of kidney stones, COPD.  Differential diagnosis possibly kidney stones versus bladder infection versus bladder mass.  Does not have any retention.  Does not sound like he is having any rectal pain or prostate issues but possible this could be prostatitis as well.  Will check a CBC, CMP, CT renal study and urinalysis.  Per my review interpretation of labs urine is negative for infection.  No significant anemia leukocytosis or electrolyte abnormality.  Creatinine unremarkable.  CT scan per radiology report shows punctate  nonobstructive right-sided kidney stones, no ureteral stones, 1 cm bladder stone.  Otherwise no acute findings.  Overall suspect that hematuria is from kidney stone but will have him follow-up with urology as he is a former smoker to make sure he does not need a cystoscopy or other further workup.  He understands return precautions.  Discharged in good condition.  This chart was dictated using voice recognition software.  Despite best efforts to proofread,  errors can occur which can change the documentation meaning.         Final Clinical Impression(s) / ED Diagnoses Final diagnoses:  Hematuria, unspecified type  Kidney stone    Rx / DC Orders ED Discharge Orders     None         Virgina Norfolk, DO 04/11/23 1749

## 2023-04-11 NOTE — Telephone Encounter (Signed)
FYI: This call has been transferred to Access Nurse. Once the result note has been entered staff can address the message at that time.  Patient called in with the following symptoms:  Red Word:blood in urine x 2 days, a little pain   Please advise at [facility] [phone]  Message is routed to Provider Pool and [facility] Triage

## 2023-04-11 NOTE — ED Provider Notes (Signed)
[facility]    CSN: [id number] Arrival date & time: 04/11/23  1459      History   Chief Complaint Chief Complaint  Patient presents with   Urinary Frequency    Entered by patient    HPI Roger Lewis is a 72 y.o. male.   Patient here today for evaluation of gross hematuria that he had for the last 3 days.  He states he did have some redness to his foreskin but started taking doxycycline and that improved symptoms.  He has not had fever.   The history is provided by the patient.  Urinary Frequency Pertinent negatives include no shortness of breath.    Past Medical History:  Diagnosis Date   COPD (chronic obstructive pulmonary disease) (HCC)    GERD (gastroesophageal reflux disease)    Renal stones     Patient Active Problem List   Diagnosis Date Noted   COPD exacerbation (HCC) 03/06/2023   Trigger finger 06/26/2022   COPD  GOLD ?  11/16/2021   Pulmonary infiltrates on CXR 11/16/2021   Healthcare maintenance 10/10/2021   GERD (gastroesophageal reflux disease) 10/10/2021   SOB (shortness of breath) 10/10/2021   History of acute otitis externa 02/27/2020   Cough 01/11/2018   Advance care planning 11/18/2017   Back pain 05/17/2016   Medicare annual wellness visit, subsequent 11/16/2015   Rash and nonspecific skin eruption 09/07/2015   Knee pain 09/07/2015    Past Surgical History:  Procedure Laterality Date   HAND SURGERY Right        Home Medications    Prior to Admission medications   Medication Sig Start Date End Date Taking? Authorizing Provider  acetaminophen (TYLENOL) 325 MG tablet Take 325 mg by mouth 3 (three) times daily as needed.    [provider]  albuterol (VENTOLIN HFA) 108 (90 Base) MCG/ACT inhaler USE 1 TO 2 INHALATIONS BY MOUTH  EVERY 6 HOURS AS NEEDED FOR  SHORTNESS OF BREATH OR WHEEZING 03/03/23   Joaquim Nam, MD  doxycycline (VIBRA-TABS) 100 MG tablet Take 1 tablet (100 mg total) by mouth 2 (two) times  daily. 03/06/23   Joaquim Nam, MD  fluticasone Aleda Grana) 50 MCG/ACT nasal spray Place 2 sprays into both nostrils daily as needed for allergies or rhinitis. 10/09/22   Joaquim Nam, MD  ibuprofen (ADVIL) 200 MG tablet Take 200 mg by mouth 3 (three) times daily as needed (with food.).    [provider]  Multiple Vitamin (MULTIVITAMIN) tablet Take 1 tablet by mouth daily.    [provider]  predniSONE (DELTASONE) 20 MG tablet Take 2 a day for 5 days, then 1 a day for 5 days, with food. Don't take with aleve/ibuprofen. 03/06/23   Joaquim Nam, MD  tamsulosin (FLOMAX) 0.4 MG CAPS capsule Take 1 capsule (0.4 mg total) by mouth daily. 04/11/23   Joaquim Nam, MD  Tiotropium Bromide-Olodaterol (STIOLTO RESPIMAT) 2.5-2.5 MCG/ACT AERS Inhale 2 puffs by mouth every morning 01/04/22   Nyoka Cowden, MD  triamcinolone cream (KENALOG) 0.5 % Apply 1 Application topically 2 (two) times daily as needed. Only use as needed on affected area 06/24/22   Joaquim Nam, MD    Family History Family History  Problem Relation Age of Onset   COPD Mother    COPD Father    Prostate cancer Paternal Uncle    Coronary artery disease Brother 62       multiple stents   Colon cancer Neg  Hx     Social History Social History   Tobacco Use   Smoking status: Former    Current packs/day: 0.00    Average packs/day: 1 pack/day for 40.0 years (40.0 ttl pk-yrs)    Types: Cigarettes    Start date: 09/09/1968    Quit date: 09/09/2008    Years since quitting: 14.5   Smokeless tobacco: Never  Substance Use Topics   Alcohol use: No    Alcohol/week: 0.0 standard drinks of alcohol    Comment: Very rare   Drug use: Never     Allergies   Penicillins and Flexeril [cyclobenzaprine]   Review of Systems Review of Systems  Constitutional:  Negative for chills and fever.  Eyes:  Negative for discharge and redness.  Respiratory:  Negative for shortness of breath.   Gastrointestinal:  Negative  for nausea and vomiting.  Genitourinary:  Positive for hematuria. Negative for dysuria.  Skin:  Positive for color change.     Physical Exam Triage Vital Signs ED Triage Vitals  Encounter Vitals Group     BP 04/11/23 1504 122/78     Systolic BP Percentile --      Diastolic BP Percentile --      Pulse Rate 04/11/23 1504 68     Resp 04/11/23 1504 16     Temp 04/11/23 1504 97.6 F (36.4 C)     Temp Source 04/11/23 1504 Oral     SpO2 04/11/23 1504 93 %     Weight --      Height --      Head Circumference --      Peak Flow --      Pain Score 04/11/23 1506 0     Pain Loc --      Pain Education --      Exclude from Growth Chart --    No data found.  Updated Vital Signs BP 122/78 (BP Location: Left Arm)   Pulse 68   Temp 97.6 F (36.4 C) (Oral)   Resp 16   SpO2 93%      Physical Exam Vitals and nursing note reviewed.  Constitutional:      General: He is not in acute distress.    Appearance: Normal appearance. He is not ill-appearing.  HENT:     Head: Normocephalic and atraumatic.  Eyes:     Conjunctiva/sclera: Conjunctivae normal.  Cardiovascular:     Rate and Rhythm: Normal rate.  Pulmonary:     Effort: Pulmonary effort is normal. No respiratory distress.  Neurological:     Mental Status: He is alert.  Psychiatric:        Mood and Affect: Mood normal.        Behavior: Behavior normal.        Thought Content: Thought content normal.      UC Treatments / Results  Labs (all labs ordered are listed, but only abnormal results are displayed) Labs Reviewed  POCT URINALYSIS DIP (MANUAL ENTRY) - Abnormal; Notable for the following components:      Result Value   Clarity, UA cloudy (*)    Spec Grav, UA >=1.030 (*)    Blood, UA large (*)    Protein Ur, POC =30 (*)    All other components within normal limits    EKG   Radiology CT Renal Stone Study  Result Date: 04/11/2023 CLINICAL DATA:  Flank pain. EXAM: CT ABDOMEN AND PELVIS WITHOUT CONTRAST TECHNIQUE:  Multidetector CT imaging of the abdomen and pelvis was performed  following the standard protocol without IV contrast. RADIATION DOSE REDUCTION: This exam was performed according to the departmental dose-optimization program which includes automated exposure control, adjustment of the mA and/or kV according to patient size and/or use of iterative reconstruction technique. COMPARISON:  CT 2008. FINDINGS: Lower chest: Emphysematous lung changes along the lung bases. No pleural effusion. Hepatobiliary: On this non IV contrast exam, grossly preserved hepatic parenchyma. Gallbladder is nondilated. Pancreas: Unremarkable. No pancreatic ductal dilatation or surrounding inflammatory changes. Spleen: Spleen is enlarged cephalocaudal length of 15 cm. Adrenals/Urinary Tract: Adrenal glands are preserved. No collecting system dilatation of either kidney. There is a punctate nonobstructing upper pole right-sided renal stone. Vascular calcifications along the right renal hilum. No ureteral stones are identified. Preserved contours of the urinary bladder with a dependent bladder stone measuring 10 mm. Stomach/Bowel: On this non oral contrast exam, the large bowel has a normal course and caliber. Sigmoid colon diverticula identified. Scattered colonic stool. Atypical distribution of bowel with loops of colon extending between the liver margin in the anterolateral abdominal wall. Normal appendix in this location. The stomach is nondilated. Small bowel is nondilated. Vascular/Lymphatic: Moderate vascular calcifications. Aorta and iliac calcifications. Normal caliber aorta and IVC. No discrete abnormal lymph node enlargement identified in the abdomen and pelvis. Reproductive: Prostate is unremarkable. Other: Mild anasarca.  Small fat containing right inguinal hernia. Musculoskeletal: Trace anterolisthesis of L3 on L4. Retrolisthesis of L5 on S1. Scattered degenerative changes seen of the spine and pelvis. IMPRESSION: Punctate  nonobstructing right-sided renal stone. No ureteral stones. 1 cm bladder stone. Colonic diverticula.  Normal appendix.  Scattered stool. Emphysematous lung changes along the lung bases. Mild splenic enlargement. Electronically Signed   By: Karen Kays M.D.   On: 04/11/2023 17:39    Procedures Procedures (including critical care time)  Medications Ordered in UC Medications - No data to display  Initial Impression / Assessment and Plan / UC Course  I have reviewed the triage vital signs and the nursing notes.  Pertinent labs & imaging results that were available during my care of the patient were reviewed by me and considered in my medical decision making (see chart for details).    With hematuria noted with no signs of infection recommended further evaluation in the emergency room for further labs and imaging.  Patient expresses understanding and is agreeable to same.   Final Clinical Impressions(s) / UC Diagnoses   Final diagnoses:  Gross hematuria     Discharge Instructions      Please report to [facility] after leaving our office  [address] [place], [place]       ED Prescriptions   None    PDMP not reviewed this encounter.   Tomi Bamberger, PA-C 04/11/23 [phone]

## 2023-04-11 NOTE — Telephone Encounter (Signed)
I spoke with pt; pt said starting 4 days ago pt noticed redness on foreskin; redness is still there but not as red as 4 days ago. Pt said notice 2 days ago blood in urine and pain rt lower back that goes from dull to sharp. Pt said last night pain in back was really bad and pt had doxycycline 100mg  left over from end of June and pt started taking doxycycline and ibuprofen last night and again this morning. Pt said now 0 pain level. Pt also said for over one wk pt has frequency of urine and voiding small amts. Pt stopped drinking coffee and drinking more water now and that urinary frequency and voiding small amts is a lot better. Pt has no abd pain and no fever. Pt said he has a hx of kidney stones and pt is very fearful of possibility of prostate CA or something beng wrong with his kidneys. I advised pt of Dr Lianne Bushy comments in this note and he said he would like to be seen today if possible. No available appts at [facility] or the [facility] offices near where pt is presently at. Pt said he would go to UC on [address]. I scheduled pt appt at [facility] 04/11/23 at 3pm with ED precautions and pt voiced understanding. I did advise pt to let UC provider know that he had restarted doxycycline last night and that pt does have flomax at pharmacy if that would be appropriate for pt to take after eval. Pt voiced appreciation of Dr Lianne Bushy good care of him and appreciated my call. Sending note to Dr Para March who is out of office as FYI and Dr Reece Agar who is in office.

## 2023-04-11 NOTE — Discharge Instructions (Signed)
  Please report to [facility] after leaving our office  [address] [place], [place]

## 2023-04-11 NOTE — Telephone Encounter (Signed)
Please triage patient about urinary symptoms.  If an acute change, then needs OV ASAP .  If this is a chronic but gradually worsening issue, then could start flomax and get set up in the near future for OV.  I went ahead and sent flomax rx in case.  Thanks.

## 2023-04-11 NOTE — Discharge Instructions (Signed)
Continue Tylenol and ibuprofen for pain.  He can take Flomax.  Please return if you develop fever, worsening pain, urinary retention.  Follow-up with urology and primary care doctor otherwise.

## 2023-04-11 NOTE — Telephone Encounter (Signed)
Have not seen note from access nurse. Dr Para March sent separate phone note 04/11/23 for me to triage pt.  I did speak with pt and complete triage. Please view that note.Fwd to Dr Para March as Lorain Childes.

## 2023-04-11 NOTE — ED Triage Notes (Signed)
Pt states blood in his urine for the past 3 days and redness to his foreskin.  States he started taking doxycycline at home and it has cleared up a little.

## 2023-04-11 NOTE — ED Notes (Signed)
 RN reviewed discharge instructions with pt. Pt verbalized understanding and had no further questions. VSS upon discharge.  

## 2023-04-13 NOTE — Telephone Encounter (Signed)
Please check on patient and let me know if he needs a referral for urology.  I saw his eval note and seeing urology is reasonable.  Thanks.

## 2023-04-14 ENCOUNTER — Telehealth: Payer: Self-pay

## 2023-04-14 DIAGNOSIS — N210 Calculus in bladder: Secondary | ICD-10-CM | POA: Diagnosis not present

## 2023-04-14 DIAGNOSIS — N200 Calculus of kidney: Secondary | ICD-10-CM | POA: Diagnosis not present

## 2023-04-14 MED ORDER — HYDROCODONE-ACETAMINOPHEN 5-325 MG PO TABS: 1.0000 | ORAL_TABLET | Freq: Four times a day (QID) | ORAL | 0 refills | Status: DC | PRN

## 2023-04-14 NOTE — Telephone Encounter (Signed)
I spoke with pt and he could not get in touch with [facility] over weekend and is going to a new pharmacy and will have flomax rx trasferred. Nothing further needed at this time.

## 2023-04-14 NOTE — Telephone Encounter (Signed)
Noted. Thanks.

## 2023-04-14 NOTE — Telephone Encounter (Addendum)
I spoke with Roger Lewis about phone note on 04/14/23 and Roger Lewis said that he is hurting badly and ED advised him to take ibuprofen for pain. Roger Lewis said he is taking ibuprofen 400 mg without any relief of pain. Roger Lewis does need referral  to urology. Roger Lewis is on the other line to [facility] in [place] now to get appt. Roger Lewis said he also is requesting hydrocodone to [facility] for pain until he can see urologist. Roger Lewis request cb when med sent to pharmacy. Sending note to Dr Para March and Para March pool and will speak with [facility] CMA.

## 2023-04-14 NOTE — Addendum Note (Signed)
Addended by: Joaquim Nam on: 04/14/2023 10:21 AM   Modules accepted: Orders

## 2023-04-14 NOTE — Telephone Encounter (Signed)
Called patient reviewed information. Will call office if any questions. Will reach out to urology to see how soon he can get seen. Roger Lewis

## 2023-04-14 NOTE — Telephone Encounter (Signed)
I put in the referral and sent the rx for hydrocodone.  Please update patient and I hope he feels better soon.  Thanks.

## 2023-04-18 ENCOUNTER — Telehealth: Payer: Self-pay

## 2023-04-18 NOTE — Telephone Encounter (Signed)
Transition Care Management Follow-up Telephone Call Date of discharge and from where: [facility] How have you been since you were released from the hospital? Doing ok  Any questions or concerns? No  Items Reviewed: Did the pt receive and understand the discharge instructions provided? Yes  Medications obtained and verified? No  Other? No  Any new allergies since your discharge? No  Dietary orders reviewed? No Do you have support at home? Yes    Follow up appointments reviewed:  PCP Hospital f/u appt confirmed? No  Scheduled to see  on  @ . Specialist Hospital f/u appt confirmed? Yes  Scheduled to see  on 8/5 @ . Are transportation arrangements needed? No  If their condition worsens, is the pt aware to call PCP or go to the Emergency Dept.? Yes Was the patient provided with contact information for the PCP's office or ED? Yes Was to pt encouraged to call back with questions or concerns? Yes

## 2023-04-18 NOTE — Telephone Encounter (Signed)
Transition Care Management Unsuccessful Follow-up Telephone Call  Date of discharge and from where:  [facility]  Attempts:  1st Attempt  Reason for unsuccessful TCM follow-up call:  No answer/busy   Lenard Forth [facility] Guide, [facility] [phone] [address] [place], [place], [place] [address] Phone: [phone] Email: [email or web address]

## 2023-04-24 ENCOUNTER — Encounter (INDEPENDENT_AMBULATORY_CARE_PROVIDER_SITE_OTHER): Payer: Self-pay

## 2023-04-25 ENCOUNTER — Other Ambulatory Visit: Payer: Self-pay | Admitting: Internal Medicine

## 2023-06-09 ENCOUNTER — Ambulatory Visit (INDEPENDENT_AMBULATORY_CARE_PROVIDER_SITE_OTHER): Payer: Medicare Other

## 2023-06-09 VITALS — Ht 69.5 in | Wt 160.0 lb

## 2023-06-09 DIAGNOSIS — Z0000 Encounter for general adult medical examination without abnormal findings: Secondary | ICD-10-CM

## 2023-06-09 NOTE — Progress Notes (Signed)
Subjective:   Roger Lewis is a 72 y.o. male who presents for Medicare Annual/Subsequent preventive examination.  Visit Complete: Virtual  I connected with  Roger Lewis on 06/09/23 by a audio enabled telemedicine application and verified that I am speaking with the correct person using two identifiers.  Patient Location: Home  Provider Location: Office/Clinic  I discussed the limitations of evaluation and management by telemedicine. The patient expressed understanding and agreed to proceed.  Because this visit was a virtual/telehealth visit, some criteria may be missing or patient reported. Any vitals not documented were not able to be obtained and vitals that have been documented are patient reported.    Cardiac Risk Factors include: advanced age (>20men, >76 women);male gender     Objective:    Today's Vitals   06/09/23 0952  Weight: 160 lb (72.6 kg)  Height: 5' 9.5" (1.765 m)   Body mass index is 23.29 kg/m.     06/09/2023   10:13 AM 04/11/2023    4:23 PM 06/05/2022   10:19 AM 05/26/2021   10:18 AM  Advanced Directives  Does Patient Have a Medical Advance Directive? No No No No  Would patient like information on creating a medical advance directive? No - Patient declined  No - Patient declined Yes (Inpatient - patient defers creating a medical advance directive and declines information at this time)    Current Medications (verified) Outpatient Encounter Medications as of 06/09/2023  Medication Sig   acetaminophen (TYLENOL) 325 MG tablet Take 325 mg by mouth 3 (three) times daily as needed.   albuterol (VENTOLIN HFA) 108 (90 Base) MCG/ACT inhaler USE 1 TO 2 INHALATIONS BY MOUTH  EVERY 6 HOURS AS NEEDED FOR  SHORTNESS OF BREATH OR WHEEZING   fluticasone (FLONASE) 50 MCG/ACT nasal spray Place 2 sprays into both nostrils daily as needed for allergies or rhinitis.   HYDROcodone-acetaminophen (NORCO/VICODIN) 5-325 MG tablet Take 1 tablet by mouth every 6 (six) hours as  needed (for pain.  sedation caution).   ibuprofen (ADVIL) 200 MG tablet Take 200 mg by mouth 3 (three) times daily as needed (with food.).   Multiple Vitamin (MULTIVITAMIN) tablet Take 1 tablet by mouth daily.   STIOLTO RESPIMAT 2.5-2.5 MCG/ACT AERS USE 2 INHALATIONS BY MOUTH ONCE  DAILY IN THE MORNING   tamsulosin (FLOMAX) 0.4 MG CAPS capsule Take 1 capsule (0.4 mg total) by mouth daily.   triamcinolone cream (KENALOG) 0.5 % Apply 1 Application topically 2 (two) times daily as needed. Only use as needed on affected area   [DISCONTINUED] doxycycline (VIBRA-TABS) 100 MG tablet Take 1 tablet (100 mg total) by mouth 2 (two) times daily. (Patient not taking: Reported on 06/09/2023)   [DISCONTINUED] predniSONE (DELTASONE) 20 MG tablet Take 2 a day for 5 days, then 1 a day for 5 days, with food. Don't take with aleve/ibuprofen. (Patient not taking: Reported on 06/09/2023)   No facility-administered encounter medications on file as of 06/09/2023.    Allergies (verified) Penicillins and Flexeril [cyclobenzaprine]   History: Past Medical History:  Diagnosis Date   COPD (chronic obstructive pulmonary disease) (HCC)    GERD (gastroesophageal reflux disease)    Renal stones    Past Surgical History:  Procedure Laterality Date   HAND SURGERY Right    Family History  Problem Relation Age of Onset   COPD Mother    COPD Father    Prostate cancer Paternal Uncle    Coronary artery disease Brother 78  multiple stents   Colon cancer Neg Hx    Social History   Socioeconomic History   Marital status: Single    Spouse name: Not on file   Number of children: Not on file   Years of education: Not on file   Highest education level: Not on file  Occupational History   Not on file  Tobacco Use   Smoking status: Former    Current packs/day: 0.00    Average packs/day: 1 pack/day for 40.0 years (40.0 ttl pk-yrs)    Types: Cigarettes    Start date: 09/09/1968    Quit date: 09/09/2008    Years since  quitting: 14.7   Smokeless tobacco: Never  Substance and Sexual Activity   Alcohol use: No    Alcohol/week: 0.0 standard drinks of alcohol    Comment: Very rare   Drug use: Never   Sexual activity: Not Currently  Other Topics Concern   Not on file  Social History Narrative   Likes to [employer]- keyboard, guitar, drums   Self employed concrete work/excavations   Raises beef cattle   2 kids, 2 step kids.     Divorced.  Lives alone   From [place]   Social Determinants of Health   Financial Resource Strain: Low Risk  (06/09/2023)   Overall Financial Resource Strain (CARDIA)    Difficulty of Paying Living Expenses: Not hard at all  Food Insecurity: No Food Insecurity (06/09/2023)   Hunger Vital Sign    Worried About Running Out of Food in the Last Year: Never true    Ran Out of Food in the Last Year: Never true  Transportation Needs: No Transportation Needs (06/09/2023)   PRAPARE - Administrator, Civil Service (Medical): No    Lack of Transportation (Non-Medical): No  Physical Activity: Insufficiently Active (06/09/2023)   Exercise Vital Sign    Days of Exercise per Week: 1 day    Minutes of Exercise per Session: 60 min  Stress: No Stress Concern Present (06/09/2023)   [employer] of Occupational Health - Occupational Stress Questionnaire    Feeling of Stress : Not at all  Social Connections: Moderately Integrated (06/09/2023)   Social Connection and Isolation Panel [NHANES]    Frequency of Communication with Friends and Family: More than three times a week    Frequency of Social Gatherings with Friends and Family: Three times a week    Attends Religious Services: More than 4 times per year    Active Member of Clubs or Organizations: Yes    Attends Banker Meetings: More than 4 times per year    Marital Status: Widowed    Tobacco Counseling Counseling given: Not Answered   Clinical Intake:  Pre-visit preparation completed: Yes  Pain  : No/denies pain     BMI - recorded: 23.29 Nutritional Status: BMI of 19-24  Normal Nutritional Risks: None Diabetes: No  How often do you need to have someone help you when you read instructions, pamphlets, or other written materials from your doctor or pharmacy?: 1 - Never  Interpreter Needed?: No  Information entered by :: R. Romi Rathel LPN   Activities of Daily Living    06/09/2023    9:56 AM  In your present state of health, do you have any difficulty performing the following activities:  Hearing? 1  Comment minor, has an aid that he wears at times  Vision? 0  Comment glasses at times  Difficulty concentrating or making decisions?  0  Walking or climbing stairs? 0  Dressing or bathing? 0  Doing errands, shopping? 0  Preparing Food and eating ? N  Using the Toilet? N  In the past six months, have you accidently leaked urine? N  Do you have problems with loss of bowel control? N  Managing your Medications? N  Managing your Finances? N  Housekeeping or managing your Housekeeping? N    Patient Care Team: Joaquim Nam, MD as PCP - General (Family Medicine)  Indicate any recent Medical Services you may have received from other than [facility] providers in the past year (date may be approximate).     Assessment:   This is a routine wellness examination for [facility].  Hearing/Vision screen Hearing Screening - Comments:: Wears one hearing aid at times Vision Screening - Comments:: Glasses at times   Goals Addressed             This Visit's Progress    Patient Stated       Needs to be more aware of his surrounding and not breathe unhealthy air       Depression Screen    06/09/2023   10:05 AM 06/05/2022   10:17 AM 05/26/2021   10:15 AM 03/22/2021   12:37 PM 11/17/2017    4:26 PM 11/14/2015   10:02 AM  PHQ 2/9 Scores  PHQ - 2 Score 0 1 0 0 0 2  PHQ- 9 Score 4 1  0      Fall Risk    06/09/2023    9:59 AM 06/05/2022   10:20 AM 05/26/2021   10:19 AM 10/06/2020    10:44 AM 11/17/2017    4:26 PM  Fall Risk   Falls in the past year? 0 0 0 0 No  Number falls in past yr: 0 0 0 0   Injury with Fall? 0 0 0 0   Risk for fall due to : No Fall Risks No Fall Risks     Follow up Falls prevention discussed;Falls evaluation completed Falls prevention discussed;Falls evaluation completed Falls evaluation completed Falls evaluation completed     MEDICARE RISK AT HOME: Medicare Risk at Home Any stairs in or around the home?: Yes If so, are there any without handrails?: Yes Home free of loose throw rugs in walkways, pet beds, electrical cords, etc?: No (discussed ways of securing rugs) Adequate lighting in your home to reduce risk of falls?: Yes Life alert?: No Use of a cane, walker or w/c?: No Grab bars in the bathroom?: No Shower chair or bench in shower?: No Elevated toilet seat or a handicapped toilet?: No   Cognitive Function:        06/09/2023   10:16 AM 06/09/2023   10:14 AM 06/05/2022   10:22 AM 05/26/2021   10:22 AM  6CIT Screen  What Year? 0 points 0 points 0 points 0 points  What month? 0 points 0 points 0 points 0 points  What time? 0 points  0 points 0 points  Count back from 20 0 points  0 points 0 points  Months in reverse 0 points  0 points 0 points  Repeat phrase 0 points  0 points 0 points  Total Score 0 points  0 points 0 points    Immunizations Immunization History  Administered Date(s) Administered   Influenza, High Dose Seasonal PF 10/20/2017   Influenza,inj,Quad PF,6+ Mos 07/20/2016   Influenza-Unspecified 10/20/2017, 09/19/2018   Pneumococcal Conjugate-13 11/17/2017    TDAP status: Due, Education has  been provided regarding the importance of this vaccine. Advised may receive this vaccine at local pharmacy or Health Dept. Aware to provide a copy of the vaccination record if obtained from local pharmacy or Health Dept. Verbalized acceptance and understanding.  Flu Vaccine status: Due, Education has been provided regarding  the importance of this vaccine. Advised may receive this vaccine at local pharmacy or Health Dept. Aware to provide a copy of the vaccination record if obtained from local pharmacy or Health Dept. Verbalized acceptance and understanding.  Pneumococcal vaccine status: Due, Education has been provided regarding the importance of this vaccine. Advised may receive this vaccine at local pharmacy or Health Dept. Aware to provide a copy of the vaccination record if obtained from local pharmacy or Health Dept. Verbalized acceptance and understanding.  Covid-19 vaccine status: Declined, Education has been provided regarding the importance of this vaccine but patient still declined. Advised may receive this vaccine at local pharmacy or Health Dept.or vaccine clinic. Aware to provide a copy of the vaccination record if obtained from local pharmacy or Health Dept. Verbalized acceptance and understanding.  Qualifies for Shingles Vaccine? Yes   Zostavax completed No   Shingrix Completed?: No.    Education has been provided regarding the importance of this vaccine. Patient has been advised to call insurance company to determine out of pocket expense if they have not yet received this vaccine. Advised may also receive vaccine at local pharmacy or Health Dept. Verbalized acceptance and understanding.  Screening Tests Health Maintenance  Topic Date Due   DTaP/Tdap/Td (1 - Tdap) Never done   Lung Cancer Screening  Never done   Zoster Vaccines- Shingrix (1 of 2) Never done   Pneumonia Vaccine 68+ Years old (2 of 2 - PPSV23 or PCV20) 11/18/2018   INFLUENZA VACCINE  04/10/2023   Medicare Annual Wellness (AWV)  06/06/2023   Fecal DNA (Cologuard)  04/03/2024   Hepatitis C Screening  Completed   HPV VACCINES  Aged Out   COVID-19 Vaccine  Discontinued    Health Maintenance  Health Maintenance Due  Topic Date Due   DTaP/Tdap/Td (1 - Tdap) Never done   Lung Cancer Screening  Never done   Zoster Vaccines- Shingrix  (1 of 2) Never done   Pneumonia Vaccine 58+ Years old (2 of 2 - PPSV23 or PCV20) 11/18/2018   INFLUENZA VACCINE  04/10/2023   Medicare Annual Wellness (AWV)  06/06/2023    Colorectal cancer screening: Type of screening: Cologuard. Completed 03/2021. Repeat every 3 years  Lung Cancer Screening: (Low Dose CT Chest recommended if Age 21-80 years, 20 pack-year currently smoking OR have quit w/in 15years.) does not qualify. Patient states he quit at least 15 years ago    Additional Screening:  Hepatitis C Screening: does qualify; Completed 11/2015  Vision Screening: Recommended annual ophthalmology exams for early detection of glaucoma and other disorders of the eye. Is the patient up to date with their annual eye exam?  Yes  Who is the provider or what is the name of the office in which the patient attends annual eye exams? [facility] If pt is not established with a provider, would they like to be referred to a provider to establish care? No .   Dental Screening: Recommended annual dental exams for proper oral hygiene    Community Resource Referral / Chronic Care Management: CRR required this visit?  No   CCM required this visit?  No     Plan:     I have  personally reviewed and noted the following in the patient's chart:   Medical and social history Use of alcohol, tobacco or illicit drugs  Current medications and supplements including opioid prescriptions. Patient is currently taking opioid prescriptions. Information provided to patient regarding non-opioid alternatives. Patient advised to discuss non-opioid treatment plan with their provider. Functional ability and status Nutritional status Physical activity Advanced directives List of other physicians Hospitalizations, surgeries, and ER visits in previous 12 months Vitals Screenings to include cognitive, depression, and falls Referrals and appointments  In addition, I have reviewed and discussed with patient certain  preventive protocols, quality metrics, and best practice recommendations. A written personalized care plan for preventive services as well as general preventive health recommendations were provided to patient.     Sydell Axon, LPN   1/61/0960   After Visit Summary: (MyChart) Due to this being a telephonic visit, the after visit summary with patients personalized plan was offered to patient via MyChart   Nurse Notes: None

## 2023-06-09 NOTE — Patient Instructions (Addendum)
[name] , Thank you for taking time to come for your Medicare Wellness Visit. I appreciate your ongoing commitment to your health goals. Please review the following plan we discussed and let me know if I can assist you in the future.   Referrals/Orders/Follow-Ups/Clinician Recommendations: Please update your vaccines as discussed   This is a list of the screening recommended for you and due dates:  Health Maintenance  Topic Date Due   DTaP/Tdap/Td vaccine (1 - Tdap) Never done   Screening for Lung Cancer  Never done   Zoster (Shingles) Vaccine (1 of 2) Never done   Pneumonia Vaccine (2 of 2 - PPSV23 or PCV20) 11/18/2018   Flu Shot  04/10/2023   Cologuard (Stool DNA test)  04/03/2024   Medicare Annual Wellness Visit  06/08/2024   Hepatitis C Screening  Completed   HPV Vaccine  Aged Out   COVID-19 Vaccine  Discontinued    Advanced directives: (Declined) Advance directive discussed with you today. Even though you declined this today, please call our office should you change your mind, and we can give you the proper paperwork for you to fill out.  Next Medicare Annual Wellness Visit scheduled for next year: Yes 10/2/9:45

## 2023-06-27 ENCOUNTER — Ambulatory Visit (INDEPENDENT_AMBULATORY_CARE_PROVIDER_SITE_OTHER): Payer: Medicare Other | Admitting: Family Medicine

## 2023-06-27 ENCOUNTER — Encounter: Payer: Self-pay | Admitting: Family Medicine

## 2023-06-27 VITALS — BP 118/72 | HR 60 | Temp 97.8°F | Ht 68.5 in | Wt 156.0 lb

## 2023-06-27 DIAGNOSIS — J449 Chronic obstructive pulmonary disease, unspecified: Secondary | ICD-10-CM

## 2023-06-27 DIAGNOSIS — Z23 Encounter for immunization: Secondary | ICD-10-CM | POA: Diagnosis not present

## 2023-06-27 DIAGNOSIS — Z0000 Encounter for general adult medical examination without abnormal findings: Secondary | ICD-10-CM | POA: Diagnosis not present

## 2023-06-27 DIAGNOSIS — E785 Hyperlipidemia, unspecified: Secondary | ICD-10-CM | POA: Diagnosis not present

## 2023-06-27 DIAGNOSIS — N210 Calculus in bladder: Secondary | ICD-10-CM

## 2023-06-27 DIAGNOSIS — Z7189 Other specified counseling: Secondary | ICD-10-CM

## 2023-06-27 DIAGNOSIS — J441 Chronic obstructive pulmonary disease with (acute) exacerbation: Secondary | ICD-10-CM | POA: Diagnosis not present

## 2023-06-27 DIAGNOSIS — Z125 Encounter for screening for malignant neoplasm of prostate: Secondary | ICD-10-CM | POA: Diagnosis not present

## 2023-06-27 LAB — LIPID PANEL
Cholesterol: 184 mg/dL (ref 0–200)
HDL: 53.5 mg/dL (ref >39.00)
LDL Cholesterol: 108 mg/dL — ABNORMAL HIGH (ref 0–99)
NonHDL: 130.46
Total CHOL/HDL Ratio: 3
Triglycerides: 114 mg/dL (ref 0.0–149.0)
VLDL: 22.8 mg/dL (ref 0.0–40.0)

## 2023-06-27 LAB — PSA, MEDICARE: PSA: 0.23 ng/mL (ref 0.10–4.00)

## 2023-06-27 MED ORDER — TAMSULOSIN HCL 0.4 MG PO CAPS: 0.4000 mg | ORAL_CAPSULE | Freq: Every day | ORAL | Status: DC | PRN

## 2023-06-27 NOTE — Patient Instructions (Addendum)
Go to the lab on the way out.   If you have mychart we'll likely use that to update you.    Take care.  Glad to see you.  Flu shot today.  Tetanus and shingles may be cheaper at the pharmacy.    Please check about seeing pulmonary.  They should call you.   Try stretching your legs prior to bedtime.

## 2023-06-27 NOTE — Progress Notes (Unsigned)
Flu today. Shingles d/w pt  PNA 2019 Tetanus d/w pt.  COVID vaccine d/w pt prev.  Cologuard neg 2022 PSA pending 2024  Advance directive- son Roger Lewis designated if patient were incapacitated.  HCV screen prev neg.  PSA and lipids pending.  See notes on labs.  COPD. Still on stioloto.  Prn SABA, ~once per day.  Compliant.  Prev cough, improved with robitussin.  Some sputum, yellowish.  Some SOB since prior cold. Last used SABA yesterday.  Scant wheeze today.    He didn't have to use all of his hydrocodone rx.  He has 1 cm bladder stone. Occ slowing of urine stream.  Discussed risk/benefit of removal vs monitoring.   He is having leg cramping at night.  Discussed stretching and adequate fluid intake.  Meds, vitals, and allergies reviewed.   ROS: Per HPI unless specifically indicated in ROS section   GEN: nad, alert and oriented HEENT: mucous membranes moist NECK: supple w/o LA CV: rrr.  PULM: ctab except for scant wheeze, no inc wob ABD: soft, +bs EXT: no edema SKIN: Well-perfused.  35 minutes were devoted to patient care in this encounter (this includes time spent reviewing the patient's file/history, interviewing and examining the patient, counseling/reviewing plan with patient).

## 2023-06-29 DIAGNOSIS — N210 Calculus in bladder: Secondary | ICD-10-CM | POA: Insufficient documentation

## 2023-06-29 NOTE — Assessment & Plan Note (Signed)
Would continue stioloto with prn SABA.  He hasn't used SABA today.  He still has some yellowish sputum but his lungs do not have any focal decrease in breath sounds.  I would continue using albuterol as needed and update me if he is having progressive or nonresponsive symptoms.

## 2023-06-29 NOTE — Assessment & Plan Note (Signed)
Flu today. Shingles d/w pt  PNA 2019 Tetanus d/w pt.  COVID vaccine d/w pt prev.  Cologuard neg 2022 PSA pending 2024  Advance directive- son Marcial Pacas designated if patient were incapacitated.  HCV screen prev neg.

## 2023-06-29 NOTE — Assessment & Plan Note (Signed)
Advance directive- son Timothy designated if patient were incapacitated.  

## 2023-06-29 NOTE — Assessment & Plan Note (Signed)
Discussed options.  He didn't have to use all of his hydrocodone rx.  He has 1 cm bladder stone. Occ slowing of urine stream.  Discussed risk/benefit of removal vs monitoring.  He is not enthused about extra procedures at this point and he wants to observe.  Routine cautions given to patient.

## 2023-07-14 ENCOUNTER — Ambulatory Visit: Payer: Medicare Other | Admitting: Pulmonary Disease

## 2023-07-14 ENCOUNTER — Encounter: Payer: Self-pay | Admitting: Pulmonary Disease

## 2023-07-14 VITALS — BP 102/62 | HR 63 | Temp 98.1°F | Ht 68.5 in | Wt 158.4 lb

## 2023-07-14 DIAGNOSIS — J449 Chronic obstructive pulmonary disease, unspecified: Secondary | ICD-10-CM | POA: Diagnosis not present

## 2023-07-14 MED ORDER — BREZTRI AEROSPHERE 160-9-4.8 MCG/ACT IN AERO: 2.0000 | INHALATION_SPRAY | Freq: Two times a day (BID) | RESPIRATORY_TRACT | 3 refills | Status: AC

## 2023-07-14 MED ORDER — BREZTRI AEROSPHERE 160-9-4.8 MCG/ACT IN AERO
2.0000 | INHALATION_SPRAY | Freq: Two times a day (BID) | RESPIRATORY_TRACT | Status: DC
Start: 2023-07-14 — End: 2024-01-02

## 2023-07-14 NOTE — Progress Notes (Signed)
Synopsis: Referred in  by Joaquim Nam, MD   Subjective:   PATIENT ID: Roger Lewis GENDER: male DOB: [birth year], MRN: [id number]  Chief Complaint  Patient presents with   Follow-up    Shortness of breath on exertion. No cough. Occasional wheezing.     HPI Roger Lewis is a 72 year old male patient with a past medical history of clinical COPD on Stiolto previous patient of Dr. Sherene Sires presenting today to the pulmonary clinic to establish care.  He reports that his shortness of breath has been worsening over the past year.  Using albuterol on a daily basis as needed couple of times.  Has been hospitalized only once for COPD exacerbation and has required systemic steroids only once for COPD exacerbation. He does report 10 to 15lbs unintentional weightloss in the past year.   Last chest x-ray in 2023 with increased reticular nodular opacities with superimposed emphysema.  Family history -no family history of lung cancer.  Social history -quit smoking in 2012.  Smoked 1 pack/day for 50 years.  ROS All systems were reviewed and are negative except for the above.  Objective:   Vitals:   07/14/23 1123  BP: 102/62  Pulse: 63  Temp: 98.1 F (36.7 C)  TempSrc: Temporal  SpO2: 96%  Weight: 158 lb 6.4 oz (71.8 kg)  Height: 5' 8.5" (1.74 m)   96% on RA BMI Readings from Last 3 Encounters:  07/14/23 23.73 kg/m  06/27/23 23.37 kg/m  06/09/23 23.29 kg/m   Wt Readings from Last 3 Encounters:  07/14/23 158 lb 6.4 oz (71.8 kg)  06/27/23 156 lb (70.8 kg)  06/09/23 160 lb (72.6 kg)    Physical Exam GEN: NAD, Healthy Appearing HEENT: Supple Neck, Reactive Pupils, EOMI  CVS: Normal S1, Normal S2, RRR, No murmurs or ES appreciated  Lungs: Biateral expiratory wheezing.  Abdomen: Soft, non tender, non distended, + BS  Extremities: Warm and well perfused, No edema  Skin: No suspicious lesions appreciated  Psych: Normal Affect  Ancillary Information   CBC    Component  Value Date/Time   WBC 7.3 04/11/2023 1704   RBC 4.39 04/11/2023 1704   HGB 12.8 (L) 04/11/2023 1704   HCT 39.4 04/11/2023 1704   PLT 254 04/11/2023 1704   MCV 89.7 04/11/2023 1704   MCH 29.2 04/11/2023 1704   MCHC 32.5 04/11/2023 1704   RDW 13.2 04/11/2023 1704   LYMPHSABS 1.8 04/11/2023 1704   MONOABS 0.8 04/11/2023 1704   EOSABS 0.2 04/11/2023 1704   BASOSABS 0.1 04/11/2023 1704    Labs and imaging were reviewed.      No data to display           Assessment & Plan:  Roger Lewis is a 72 year old male patient with a past medical history of clinical COPD on Stiolto previous patient of Dr. Sherene Sires presenting today to the pulmonary clinic to establish care.  #Clinical COPD with emphysema  DOE worsening with years. Unintentional 10lbs weight loss.   []  PFTs  []  CT scan of the chest (unilateral wheezing with unintentional weightloss and presvious smoker)  []  start Budesonide-Formoterol-Glycopyrrolate [Breztri] 2 puffs twice a day.  []  C/w Albuterol as needed.  []  Will enroll in LDCT on next visit.   Return in about 3 months (around 10/14/2023).  I spent 60 minutes caring for this patient today, including preparing to see the patient, obtaining a medical history , reviewing a separately obtained history, performing a medically appropriate examination and/or evaluation, counseling and  educating the patient/family/caregiver, ordering medications, tests, or procedures, documenting clinical information in the electronic health record, and independently interpreting results (not separately reported/billed) and communicating results to the patient/family/caregiver  Janann Colonel, MD [facility] 07/14/2023 2:48 PM

## 2023-08-14 ENCOUNTER — Ambulatory Visit
Admission: RE | Admit: 2023-08-14 | Discharge: 2023-08-14 | Disposition: A | Discharge status: Home / Self Care | Payer: Medicare Other | Source: Ambulatory Visit | Attending: Family Medicine | Admitting: Family Medicine

## 2023-08-14 DIAGNOSIS — R918 Other nonspecific abnormal finding of lung field: Secondary | ICD-10-CM | POA: Diagnosis not present

## 2023-08-14 DIAGNOSIS — J449 Chronic obstructive pulmonary disease, unspecified: Secondary | ICD-10-CM | POA: Insufficient documentation

## 2023-08-14 DIAGNOSIS — R911 Solitary pulmonary nodule: Secondary | ICD-10-CM

## 2023-08-14 DIAGNOSIS — R0600 Dyspnea, unspecified: Secondary | ICD-10-CM | POA: Diagnosis not present

## 2023-08-14 DIAGNOSIS — J432 Centrilobular emphysema: Secondary | ICD-10-CM | POA: Diagnosis not present

## 2023-09-27 ENCOUNTER — Encounter: Payer: Self-pay | Admitting: Pulmonary Disease

## 2023-09-27 ENCOUNTER — Telehealth: Payer: Self-pay | Admitting: Pulmonary Disease

## 2023-09-27 DIAGNOSIS — R911 Solitary pulmonary nodule: Secondary | ICD-10-CM

## 2023-09-27 DIAGNOSIS — J439 Emphysema, unspecified: Secondary | ICD-10-CM

## 2023-09-27 MED ORDER — STIOLTO RESPIMAT 2.5-2.5 MCG/ACT IN AERS: 2.0000 | INHALATION_SPRAY | Freq: Every day | RESPIRATORY_TRACT | 6 refills | Status: DC

## 2023-09-27 NOTE — Addendum Note (Signed)
Encounter addended by: Janann Colonel, MD on: 09/27/2023 3:38 PM  Actions taken: Visit diagnoses modified

## 2023-09-27 NOTE — Telephone Encounter (Signed)
Discussed CT chest results with multiple small nodules largest measuring 6mm. He is high risk for malignancy. We will repeat a CT chest in 6 months.   Janann Colonel, MD [facility] 09/27/2023 3:37 PM

## 2023-10-05 ENCOUNTER — Other Ambulatory Visit: Payer: Self-pay

## 2023-10-05 ENCOUNTER — Encounter (HOSPITAL_BASED_OUTPATIENT_CLINIC_OR_DEPARTMENT_OTHER): Payer: Self-pay | Admitting: Emergency Medicine

## 2023-10-05 ENCOUNTER — Emergency Department (HOSPITAL_BASED_OUTPATIENT_CLINIC_OR_DEPARTMENT_OTHER): Payer: Medicare Other

## 2023-10-05 ENCOUNTER — Emergency Department (HOSPITAL_BASED_OUTPATIENT_CLINIC_OR_DEPARTMENT_OTHER)
Admission: EM | Admit: 2023-10-05 | Discharge: 2023-10-05 | Disposition: A | Discharge status: Home / Self Care | Payer: Medicare Other | Attending: Emergency Medicine | Admitting: Emergency Medicine

## 2023-10-05 DIAGNOSIS — J439 Emphysema, unspecified: Secondary | ICD-10-CM | POA: Diagnosis not present

## 2023-10-05 DIAGNOSIS — S3992XA Unspecified injury of lower back, initial encounter: Secondary | ICD-10-CM | POA: Diagnosis present

## 2023-10-05 DIAGNOSIS — R918 Other nonspecific abnormal finding of lung field: Secondary | ICD-10-CM | POA: Diagnosis not present

## 2023-10-05 DIAGNOSIS — J441 Chronic obstructive pulmonary disease with (acute) exacerbation: Secondary | ICD-10-CM | POA: Diagnosis not present

## 2023-10-05 DIAGNOSIS — R911 Solitary pulmonary nodule: Secondary | ICD-10-CM | POA: Insufficient documentation

## 2023-10-05 DIAGNOSIS — M4804 Spinal stenosis, thoracic region: Secondary | ICD-10-CM | POA: Diagnosis not present

## 2023-10-05 DIAGNOSIS — M546 Pain in thoracic spine: Secondary | ICD-10-CM | POA: Diagnosis not present

## 2023-10-05 DIAGNOSIS — S39012A Strain of muscle, fascia and tendon of lower back, initial encounter: Secondary | ICD-10-CM | POA: Insufficient documentation

## 2023-10-05 DIAGNOSIS — X500XXA Overexertion from strenuous movement or load, initial encounter: Secondary | ICD-10-CM | POA: Diagnosis not present

## 2023-10-05 LAB — CBC WITH DIFFERENTIAL/PLATELET
Abs Immature Granulocytes: 0.01 10*3/uL (ref 0.00–0.07)
Basophils Absolute: 0.1 10*3/uL (ref 0.0–0.1)
Basophils Relative: 1 %
Eosinophils Absolute: 0.1 10*3/uL (ref 0.0–0.5)
Eosinophils Relative: 2 %
HCT: 37.2 % — ABNORMAL LOW (ref 39.0–52.0)
Hemoglobin: 12.9 g/dL — ABNORMAL LOW (ref 13.0–17.0)
Immature Granulocytes: 0 %
Lymphocytes Relative: 21 %
Lymphs Abs: 1.3 10*3/uL (ref 0.7–4.0)
MCH: 30.4 pg (ref 26.0–34.0)
MCHC: 34.7 g/dL (ref 30.0–36.0)
MCV: 87.5 fL (ref 80.0–100.0)
Monocytes Absolute: 0.7 10*3/uL (ref 0.1–1.0)
Monocytes Relative: 12 %
Neutro Abs: 3.9 10*3/uL (ref 1.7–7.7)
Neutrophils Relative %: 64 %
Platelets: 228 10*3/uL (ref 150–400)
RBC: 4.25 MIL/uL (ref 4.22–5.81)
RDW: 12.9 % (ref 11.5–15.5)
WBC: 6.1 10*3/uL (ref 4.0–10.5)
nRBC: 0 % (ref 0.0–0.2)

## 2023-10-05 LAB — BASIC METABOLIC PANEL
Anion gap: 7 (ref 5–15)
BUN: 15 mg/dL (ref 8–23)
CO2: 31 mmol/L (ref 22–32)
Calcium: 9.9 mg/dL (ref 8.9–10.3)
Chloride: 101 mmol/L (ref 98–111)
Creatinine, Ser: 0.8 mg/dL (ref 0.61–1.24)
GFR, Estimated: >60 mL/min (ref >60)
Glucose, Bld: 93 mg/dL (ref 70–99)
Potassium: 4.1 mmol/L (ref 3.5–5.1)
Sodium: 139 mmol/L (ref 135–145)

## 2023-10-05 MED ORDER — METHYLPREDNISOLONE 4 MG PO TBPK: ORAL_TABLET | ORAL | 0 refills | Status: DC

## 2023-10-05 MED ORDER — METHYLPREDNISOLONE SODIUM SUCC 125 MG IJ SOLR
125.0000 mg | Freq: Once | INTRAMUSCULAR | Status: AC
  Administered 2023-10-05: 125 mg via INTRAVENOUS
  Filled 2023-10-05: qty 2

## 2023-10-05 MED ORDER — ONDANSETRON HCL 4 MG/2ML IJ SOLN
4.0000 mg | Freq: Once | INTRAMUSCULAR | Status: AC
  Administered 2023-10-05: 4 mg via INTRAVENOUS
  Filled 2023-10-05: qty 2

## 2023-10-05 MED ORDER — ALBUTEROL SULFATE (2.5 MG/3ML) 0.083% IN NEBU
5.0000 mg | INHALATION_SOLUTION | Freq: Once | RESPIRATORY_TRACT | Status: AC
  Administered 2023-10-05: 5 mg via RESPIRATORY_TRACT
  Filled 2023-10-05: qty 6

## 2023-10-05 MED ORDER — MORPHINE SULFATE (PF) 4 MG/ML IV SOLN
4.0000 mg | Freq: Once | INTRAVENOUS | Status: AC
  Administered 2023-10-05: 4 mg via INTRAVENOUS
  Filled 2023-10-05: qty 1

## 2023-10-05 MED ORDER — IPRATROPIUM BROMIDE 0.02 % IN SOLN
0.5000 mg | Freq: Once | RESPIRATORY_TRACT | Status: AC
  Administered 2023-10-05: 0.5 mg via RESPIRATORY_TRACT
  Filled 2023-10-05: qty 2.5

## 2023-10-05 MED ORDER — HYDROCODONE-ACETAMINOPHEN 5-325 MG PO TABS: 1.0000 | ORAL_TABLET | Freq: Four times a day (QID) | ORAL | 0 refills | Status: DC | PRN

## 2023-10-05 NOTE — ED Notes (Signed)
Pt reports taking advil, tylenol and a small amount of hydrocodone at home around noon today with some relief.

## 2023-10-05 NOTE — Discharge Instructions (Signed)
As we discussed, you likely have muscle strain as your CT scan did not show any fractures.  You do have pulmonary nodules on your CT scan  Please take prednisone as prescribed  Please take Norco for severe pain  Please follow-up with your pulmonary doctor  Avoid heavy lifting for a week  Return to ER if you have worse shortness of breath or back pain

## 2023-10-05 NOTE — ED Triage Notes (Signed)
Pt reports mid back pain after lifting hay bales thurs evening.  Pt denies CP.  Reports some SOB but states that's about at his norm and he's treating it at home with inhalers.    Pain worse with movement.

## 2023-10-05 NOTE — ED Provider Notes (Signed)
[place] EMERGENCY DEPARTMENT AT [facility] Provider Note   CSN: [id number] Arrival date & time: 10/05/23  1358     History  Chief Complaint  Patient presents with   Back Pain    ROBERTSON COLCLOUGH is a 73 y.o. male history of COPD, here presenting with back pain.  Patient states that 2 days ago, he was lifting some hay bales and then felt some pain in his mid back.  Patient states that pain is worse with movement.  Patient also has COPD and has been wheezing more than usual.  Patient is using his albuterol and also using breztri at home with minimal relief.  Of note, patient states that he is ordered for CT chest outpatient to monitor his COPD.  The history is provided by the patient.       Home Medications Prior to Admission medications   Medication Sig Start Date End Date Taking? Authorizing Provider  acetaminophen (TYLENOL) 325 MG tablet Take 325 mg by mouth 3 (three) times daily as needed.    [provider]  albuterol (VENTOLIN HFA) 108 (90 Base) MCG/ACT inhaler USE 1 TO 2 INHALATIONS BY MOUTH  EVERY 6 HOURS AS NEEDED FOR  SHORTNESS OF BREATH OR WHEEZING 03/03/23   Joaquim Nam, MD  Budeson-Glycopyrrol-Formoterol (BREZTRI AEROSPHERE) 160-9-4.8 MCG/ACT AERO Inhale 2 puffs into the lungs in the morning and at bedtime. 07/14/23   Assaker, [place] Bali, MD  Budeson-Glycopyrrol-Formoterol (BREZTRI AEROSPHERE) 160-9-4.8 MCG/ACT AERO Inhale 2 puffs into the lungs in the morning and at bedtime. 07/14/23   Assaker, [place]ali, MD  fluticasone (FLONASE) 50 MCG/ACT nasal spray Place 2 sprays into both nostrils daily as needed for allergies or rhinitis. 10/09/22   Joaquim Nam, MD  HYDROcodone-acetaminophen (NORCO/VICODIN) 5-325 MG tablet Take 1 tablet by mouth every 6 (six) hours as needed (for pain.  sedation caution). Patient not taking: Reported on 07/14/2023 04/14/23   Joaquim Nam, MD  ibuprofen (ADVIL) 200 MG tablet Take 200 mg by mouth 3 (three) times daily  as needed (with food.).    [provider]  Multiple Vitamin (MULTIVITAMIN) tablet Take 1 tablet by mouth daily.    [provider]  STIOLTO RESPIMAT 2.5-2.5 MCG/ACT AERS USE 2 INHALATIONS BY MOUTH ONCE  DAILY IN THE MORNING 04/25/23   Nyoka Cowden, MD  tamsulosin (FLOMAX) 0.4 MG CAPS capsule Take 1 capsule (0.4 mg total) by mouth daily as needed. 06/27/23   Joaquim Nam, MD  Tiotropium Bromide-Olodaterol (STIOLTO RESPIMAT) 2.5-2.5 MCG/ACT AERS Inhale 2 puffs into the lungs daily. 09/27/23   Assaker, [place], MD  triamcinolone cream (KENALOG) 0.5 % Apply 1 Application topically 2 (two) times daily as needed. Only use as needed on affected area 06/24/22   Joaquim Nam, MD      Allergies    Penicillins and Flexeril [cyclobenzaprine]    Review of Systems   Review of Systems  Respiratory:  Positive for cough.   Musculoskeletal:  Positive for back pain.  All other systems reviewed and are negative.   Physical Exam Updated Vital Signs BP 139/86   Pulse 63   Temp 97.7 F (36.5 C) (Oral)   Resp 16   SpO2 97%  Physical Exam Vitals and nursing note reviewed.  Constitutional:      Comments: Chronically ill and slightly uncomfortable  HENT:     Head: Normocephalic.     Nose: Nose normal.     Mouth/Throat:     Mouth: Mucous membranes are moist.  Eyes:     Extraocular Movements: Extraocular movements intact.     Pupils: Pupils are equal, round, and reactive to light.  Cardiovascular:     Rate and Rhythm: Normal rate.     Pulses: Normal pulses.  Pulmonary:     Effort: Pulmonary effort is normal.     Comments: Mild diffuse wheezing but no crackles Abdominal:     General: Abdomen is flat.     Palpations: Abdomen is soft.  Musculoskeletal:     Cervical back: Normal range of motion and neck supple.     Comments: Patient has tenderness of the mid thoracic area.  No obvious deformity.  No saddle anesthesia  Skin:    General: Skin is warm.     Capillary  Refill: Capillary refill takes less than 2 seconds.  Neurological:     General: No focal deficit present.     Mental Status: He is oriented to person, place, and time.  Psychiatric:        Mood and Affect: Mood normal.        Behavior: Behavior normal.     ED Results / Procedures / Treatments   Labs (all labs ordered are listed, but only abnormal results are displayed) Labs Reviewed  CBC WITH DIFFERENTIAL/PLATELET - Abnormal; Notable for the following components:      Result Value   Hemoglobin 12.9 (*)    HCT 37.2 (*)    All other components within normal limits  BASIC METABOLIC PANEL    EKG None  Radiology No results found.  Procedures Procedures    Medications Ordered in ED Medications  albuterol (PROVENTIL) (2.5 MG/3ML) 0.083% nebulizer solution 5 mg (5 mg Nebulization Given 10/05/23 1743)  ipratropium (ATROVENT) nebulizer solution 0.5 mg (0.5 mg Nebulization Given 10/05/23 1744)  ondansetron (ZOFRAN) injection 4 mg (4 mg Intravenous Given 10/05/23 1739)  methylPREDNISolone sodium succinate (SOLU-MEDROL) 125 mg/2 mL injection 125 mg (125 mg Intravenous Given 10/05/23 1738)    ED Course/ Medical Decision Making/ A&P                                 Medical Decision Making DAILON SHEERAN is a 73 y.o. male here presenting with back pain and shortness of breath.  Patient has history of COPD and scheduled for outpatient CT scan.  Patient also recently lifted heavy and now has upper back pain.  I think likely muscle strain.  Will get CT chest Noncon and also CT thoracic spine.  Will give pain medicine and steroids and albuterol.  7:39 PM I reassessed patient and he is feeling better now.  No wheezing now.  CT chest showed multiple pulmonary nodules.  Patient has pulmonology follow-up.  CT thoracic spine did not show any acute process.  Patient only had several pills of hydrocodone and does not have a pain management doctor.  Will discharge home with pain medicine and  steroids   Problems Addressed: Back strain, initial encounter: acute illness or injury Chronic obstructive pulmonary disease with acute exacerbation (HCC): acute illness or injury Pulmonary nodule: chronic illness or injury  Amount and/or Complexity of Data Reviewed Labs: ordered. Decision-making details documented in ED Course. Radiology: ordered and independent interpretation performed.  Risk Prescription drug management.    Final Clinical Impression(s) / ED Diagnoses Final diagnoses:  None    Rx / DC Orders ED Discharge Orders     None  Charlynne Pander, MD 10/05/23 [phone]

## 2023-10-06 ENCOUNTER — Telehealth: Payer: Self-pay

## 2023-10-06 MED ORDER — AEROCHAMBER MV MISC: 0 refills | Status: DC

## 2023-10-06 NOTE — Telephone Encounter (Signed)
Per chart review tab pt was seen [facility] on 10/05/23. Sending note to Dr Para March who is out of office and Dr Reece Agar who is in office.

## 2023-10-06 NOTE — Addendum Note (Signed)
Addended by: Janann Colonel on: 10/06/2023 08:45 AM   Modules accepted: Orders

## 2023-10-06 NOTE — Telephone Encounter (Signed)
Noted treated with shot of steroid and morphine, discharged with hydrocodone. Had T spine and chest CTs - no acute fracture but evidence of several old vertebral fractures.

## 2023-10-06 NOTE — Addendum Note (Signed)
Addended by: Janann Colonel on: 10/06/2023 08:46 AM   Modules accepted: Orders

## 2023-10-06 NOTE — Telephone Encounter (Signed)
Marland Kitchen

## 2023-10-10 ENCOUNTER — Other Ambulatory Visit: Payer: Self-pay

## 2023-10-10 NOTE — Telephone Encounter (Signed)
Copied from CRM [id number]. Topic: Clinical - Medication Question >> Oct 10, 2023  1:56 PM Adele Barthel wrote: Reason for CRM: Patient is calling in to see if Dr. Para March would be able to send in additional HYDROcodone-acetaminophen (NORCO/VICODIN) 5-325 MG tablet, he is still experiencing extreme pain and is out of tablets. CB# [phone]

## 2023-10-13 ENCOUNTER — Telehealth: Payer: Self-pay

## 2023-10-13 NOTE — Telephone Encounter (Signed)
Copied from CRM [id number]. Topic: Clinical - Prescription Issue >> Oct 13, 2023  1:58 PM Truddie Crumble wrote: Reason for CRM: patient called stating he is checking on a prescription that he requested on Friday. Patient stated he was in a lot of pain all weekend and it is wrong that he can not speak to his doctor when he calls. Patent stated his Insurance pays for his doctor and it is wrong he can not speak to him   Also added from another CRM  Reason for CRM: Patient called stating he requested a refill on HYDROcodone-acetaminophen (NORCO/VICODIN) 5-325 MG tablet. Stated to patient that med refill was completed on 1/26 per patients chart . Advised patient to follow up with pharmacy regarding medication

## 2023-10-14 ENCOUNTER — Ambulatory Visit: Payer: Self-pay | Admitting: Family Medicine

## 2023-10-14 ENCOUNTER — Ambulatory Visit: Payer: Medicare Other

## 2023-10-14 ENCOUNTER — Ambulatory Visit: Payer: Medicare Other | Admitting: Pulmonary Disease

## 2023-10-14 MED ORDER — HYDROCODONE-ACETAMINOPHEN 5-325 MG PO TABS: 1.0000 | ORAL_TABLET | Freq: Four times a day (QID) | ORAL | 0 refills | Status: DC | PRN

## 2023-10-14 NOTE — Telephone Encounter (Signed)
Spoke with patient and advised that Dr. Para March is out of the office and once he is back he will be able to address the refill

## 2023-10-14 NOTE — Telephone Encounter (Signed)
Pt called for update on med refill? Pt requested a call back from nurse today, if possible. Call back # [phone]

## 2023-10-14 NOTE — Telephone Encounter (Signed)
Closing encounter spoke with patient in reference to this already

## 2023-10-14 NOTE — Telephone Encounter (Signed)
Closing encounter. Patient has been notified that rx has been sent

## 2023-10-14 NOTE — Telephone Encounter (Signed)
Please let him know I am working on it.  Thanks.

## 2023-10-14 NOTE — Telephone Encounter (Signed)
 Chief Complaint: Severe back pain Symptoms: Back pain in middle of back on either side of spine Frequency: Constant > 1 week Pertinent Negatives: Patient denies weakness, numbness Disposition: [] ED /[] Urgent Care (no appt availability in office) / [x] Appointment(In office/virtual)/ []  [facility]/ [] Home Care/ [] Refused Recommended Disposition /[] [facility] [place] [facility]/ []  Follow-up with PCP Additional Notes: Patient called in stating he was checking on his refill request of Norco. Assessed patient to determine cause of back pain. 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 Patient went to the ER and was told he had likely muscle separation from his spine. Patient was written a 3 day script of Norco but patient is in severe, uncontrolled pain. Advised patient that I see a refill request has been sent in for the [facility] but he will need further evaluation from provider as well. Patient in-office appt made for tomorrow for further evaluation, but patient is asking if Norco refill can be called in today to the [facility] on [address] listed on his Pharmacy Profile to hold him over until tomorrow. Patient denies weakness or numbness of arms or legs. Patient states his pain is 10/10.    Copied from CRM [id number]. Topic: Clinical - Red Word Triage >> Oct 14, 2023  1:01 PM Corin V wrote: [facility] that prompted transfer to Nurse Triage: Patient has been trying to get additional pain med refills from Dr. Cleatus and has not gotten a call back. He is in severe pain in the middle of his back. Reason for Disposition  [1] MODERATE back pain (e.g., interferes with normal activities) AND [2] present > 3 days  Answer Assessment - Initial Assessment Questions 1. ONSET: When did the pain begin?      Went to ER a week prior to Sunday (09/28/23) 2. LOCATION: Where does it hurt? (upper, mid or lower back)      Middle of spine 3. SEVERITY: How bad is the pain?  (e.g., Scale 1-10; mild, moderate, or severe)   - MILD (1-3): Doesn't interfere with normal activities.    - MODERATE (4-7): Interferes with normal activities or awakens from sleep.    - SEVERE (8-10): Excruciating pain, unable to do any normal activities.      Severe - can't sit up or lay down 4. PATTERN: Is the pain constant? (e.g., yes, no; constant, intermittent)      Constant 5. RADIATION: Does the pain shoot into your legs or somewhere else?     No 6. CAUSE:  What do you think is causing the back pain?      Patient injured back by throwing bales in preparation for snow and slipped on ice 7. BACK OVERUSE:  Any recent lifting of heavy objects, strenuous work or exercise?     No 8. MEDICINES: What have you taken so far for the pain? (e.g., nothing, acetaminophen , NSAIDS)     Norco prescribed by ER for 3 days  9. NEUROLOGIC SYMPTOMS: Do you have any weakness, numbness, or problems with bowel/bladder control?     No 10. OTHER SYMPTOMS: Do you have any other symptoms? (e.g., fever, abdomen pain, burning with urination, blood in urine)       Having trouble breathing because it's exacerbating COPD  Protocols used: Back Pain-A-AH

## 2023-10-14 NOTE — Telephone Encounter (Signed)
Sent. Thanks.  Please see other phone note and have patient f/u if pain isn't getting better .

## 2023-10-14 NOTE — Telephone Encounter (Signed)
 Patient notified

## 2023-10-15 ENCOUNTER — Other Ambulatory Visit: Payer: Self-pay | Admitting: Family

## 2023-10-15 ENCOUNTER — Encounter: Payer: Self-pay | Admitting: Family

## 2023-10-15 ENCOUNTER — Telehealth: Payer: Self-pay | Admitting: Family

## 2023-10-15 ENCOUNTER — Ambulatory Visit (INDEPENDENT_AMBULATORY_CARE_PROVIDER_SITE_OTHER)
Admission: RE | Admit: 2023-10-15 | Discharge: 2023-10-15 | Disposition: A | Discharge status: Home / Self Care | Payer: Medicare Other | Source: Ambulatory Visit | Attending: Family | Admitting: Family

## 2023-10-15 ENCOUNTER — Ambulatory Visit (INDEPENDENT_AMBULATORY_CARE_PROVIDER_SITE_OTHER): Payer: Medicare Other | Admitting: Family

## 2023-10-15 VITALS — BP 114/72 | HR 62 | Temp 98.1°F | Ht 68.5 in | Wt 160.6 lb

## 2023-10-15 DIAGNOSIS — R9389 Abnormal findings on diagnostic imaging of other specified body structures: Secondary | ICD-10-CM

## 2023-10-15 DIAGNOSIS — R0789 Other chest pain: Secondary | ICD-10-CM

## 2023-10-15 DIAGNOSIS — M8580 Other specified disorders of bone density and structure, unspecified site: Secondary | ICD-10-CM | POA: Diagnosis not present

## 2023-10-15 DIAGNOSIS — I700 Atherosclerosis of aorta: Secondary | ICD-10-CM | POA: Diagnosis not present

## 2023-10-15 DIAGNOSIS — R0781 Pleurodynia: Secondary | ICD-10-CM | POA: Diagnosis not present

## 2023-10-15 DIAGNOSIS — M439 Deforming dorsopathy, unspecified: Secondary | ICD-10-CM

## 2023-10-15 DIAGNOSIS — R911 Solitary pulmonary nodule: Secondary | ICD-10-CM

## 2023-10-15 DIAGNOSIS — M62838 Other muscle spasm: Secondary | ICD-10-CM

## 2023-10-15 DIAGNOSIS — S3992XA Unspecified injury of lower back, initial encounter: Secondary | ICD-10-CM

## 2023-10-15 MED ORDER — PREDNISONE 10 MG (21) PO TBPK: ORAL_TABLET | ORAL | 0 refills | Status: DC

## 2023-10-15 MED ORDER — TIZANIDINE HCL 4 MG PO TABS: ORAL_TABLET | ORAL | 0 refills | Status: DC

## 2023-10-15 NOTE — Assessment & Plan Note (Signed)
 Incidental finding Strict lipid control  Reduce cvd risk factors.

## 2023-10-15 NOTE — Assessment & Plan Note (Signed)
 Still in constant pain with use of opiod as well as tylenol  in addition Advised on safety monitoring all modes of tylenol  intake with pain meds and otc not to exceed 3 g  Repeat right chest /rib xray today  Continue with back brace as with some improvement Conservative measures discussed

## 2023-10-15 NOTE — Patient Instructions (Addendum)
 Recommend blue emu for your topical use for pain.   Watch tylenol  usage not to go over 3000 mg once daily   ------------------------------------  I have sent an electronic order over to your preferred location for the following:   []   2D Mammogram  []   3D Mammogram  [x]   Bone Density   Please give this center a call to get scheduled at your convenience.  [x]   [facility]  [address] [place] [address]  [phone]  Make sure to wear two piece  clothing  No lotions powders or deodorants the day of the appointment Make sure to bring picture ID and insurance card.  Bring list of medications you are currently taking including any supplements.

## 2023-10-15 NOTE — Progress Notes (Signed)
 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 A CT of his chest and spine were done at the hospital, they were normal. Pt is still in quite a bit of pain. Dr. Cleatus refilled Hydrocodone  for him on 10/14/2023.    HPI: Roger Lewis is a 73 y.o. 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 A CT of his chest and spine were done at the hospital, they were normal. Pt is still in quite a bit of pain. Dr. Cleatus refilled Hydrocodone  for him on 10/14/2023.) . Went to ER 10/04/22 for back pain after lifting hay bales with some pain in mid back. Pain worse with movement. He also noted was wheezing more than normal (has copd) in ER was noted on PE with mild diffuse wheezing and tenderness of the mid thoracic aspect.   CT chest and thoracic spine ordered. Was taking hydrocodone  for the pain of which was refilled 2/4 by pcp for additional control of pain. No acute findings on imaging, however diffusely decreased bone density seen incidentally. CT chest with incidental moderate atherosclerotic plaque thoracic aorta, four vessel coronary artery calcifications. Tiny hiatal hernia. Pulmonary nodule measuring up to 6 mm (rpeeat in 3-6 months recommended to monitor stability already appears to be ordered) with stable right upper lobe 4 mm pulmonary micro nodules. 3 mm calcification right kidney. Emphysema.   New complaints: He states today that he still is having a lot of pain, he is taking hydrocodone  around the clock as prescribed to even be able to function. Hurts to lie down hurts to get up, in one position for too long has to move as still with pain. The pain is dull and achy but more intense with the movement. He denies sob anymore doing well. He is also taking tylenol  but is  aware of the hydrocodone  with acetaminophen  and is watching for less than 3 g daily. Most of the pain is mid back along lateral side to the mid chest area. No chest pain, palpitations. He does state slightly muscular but feels also 'like he bruised his lung'. The other night was lying down he had to sit up just to breath because it was so painful to breath while lying down.   Is wearing a back brace to help with the pain. He states this is helping with the pain slightly.   C/o left ear itching, he wears hearing aids and comes and goes. Currently itching.    Social history:  Relevant past medical, surgical, family and social history reviewed and updated as indicated. Interim medical history since our last visit reviewed.  Allergies and medications reviewed and updated.  DATA REVIEWED: CHART IN EPIC     ROS: Negative unless specifically indicated above in HPI.    Current Outpatient Medications:    acetaminophen  (TYLENOL ) 325 MG tablet, Take 325 mg by mouth 3 (three) times daily as needed., Disp: , Rfl:    albuterol  (VENTOLIN  HFA) 108 (90 Base) MCG/ACT inhaler, USE 1 TO 2 INHALATIONS BY MOUTH  EVERY 6 HOURS AS NEEDED FOR  SHORTNESS OF BREATH OR WHEEZING, Disp: 6.7 g, Rfl: 13   Budeson-Glycopyrrol-Formoterol (BREZTRI  AEROSPHERE) 160-9-4.8 MCG/ACT AERO, Inhale 2 puffs into the lungs in the morning  and at bedtime., Disp: 3 each, Rfl: 3   Budeson-Glycopyrrol-Formoterol (BREZTRI  AEROSPHERE) 160-9-4.8 MCG/ACT AERO, Inhale 2 puffs into the lungs in the morning and at bedtime., Disp: , Rfl:    fluticasone  (FLONASE ) 50 MCG/ACT nasal spray, Place 2 sprays into both nostrils daily as needed for allergies or rhinitis., Disp: 16 g, Rfl: 3   HYDROcodone -acetaminophen  (NORCO/VICODIN) 5-325 MG tablet, Take 1 tablet by mouth every 6 (six) hours as needed., Disp: 12 tablet, Rfl: 0   HYDROcodone -acetaminophen  (NORCO/VICODIN) 5-325 MG tablet, Take 1 tablet by mouth every 6 (six) hours as needed (for pain.   sedation caution)., Disp: 20 tablet, Rfl: 0   ibuprofen  (ADVIL ) 200 MG tablet, Take 200 mg by mouth 3 (three) times daily as needed (with food.)., Disp: , Rfl:    Multiple Vitamin (MULTIVITAMIN) tablet, Take 1 tablet by mouth daily., Disp: , Rfl:    predniSONE  (STERAPRED UNI-PAK 21 TAB) 10 MG (21) TBPK tablet, Take as directed, Disp: 1 each, Rfl: 0   Spacer/Aero-Holding Chambers (AEROCHAMBER MV) inhaler, Use as instructed, Disp: 1 each, Rfl: 0   STIOLTO RESPIMAT  2.5-2.5 MCG/ACT AERS, USE 2 INHALATIONS BY MOUTH ONCE  DAILY IN THE MORNING, Disp: 12 g, Rfl: 3   tamsulosin  (FLOMAX ) 0.4 MG CAPS capsule, Take 1 capsule (0.4 mg total) by mouth daily as needed., Disp: , Rfl:    Tiotropium Bromide-Olodaterol (STIOLTO RESPIMAT ) 2.5-2.5 MCG/ACT AERS, Inhale 2 puffs into the lungs daily., Disp: 3 each, Rfl: 6   tiZANidine  (ZANAFLEX ) 4 MG tablet, Take 1/2 to one tablet at bedtime prn muscle spasm, Disp: 30 tablet, Rfl: 0   triamcinolone  cream (KENALOG ) 0.5 %, Apply 1 Application topically 2 (two) times daily as needed. Only use as needed on affected area, Disp: 60 g, Rfl: 4      Objective:    BP 114/72 (BP Location: Left Arm, Patient Position: Sitting, Cuff Size: Normal)   Pulse 62   Temp 98.1 F (36.7 C) (Temporal)   Ht 5' 8.5 (1.74 m)   Wt 160 lb 9.6 oz (72.8 kg)   SpO2 95%   BMI 24.06 kg/m   Wt Readings from Last 3 Encounters:  10/15/23 160 lb 9.6 oz (72.8 kg)  07/14/23 158 lb 6.4 oz (71.8 kg)  06/27/23 156 lb (70.8 kg)    Physical Exam Vitals reviewed.  Constitutional:      General: He is not in acute distress.    Appearance: Normal appearance. He is normal weight. He is not ill-appearing, toxic-appearing or diaphoretic.  Cardiovascular:     Rate and Rhythm: Normal rate and regular rhythm.  Pulmonary:     Effort: Pulmonary effort is normal.     Breath sounds: Examination of the right-upper field reveals wheezing. Wheezing (expiratory wheeze) present.     Comments: Pain upon breathing  due to rib pain  Musculoskeletal:     Thoracic back: Signs of trauma, spasms and tenderness present. Decreased range of motion.  Neurological:     General: No focal deficit present.     Mental Status: He is alert and oriented to person, place, and time. Mental status is at baseline.  Psychiatric:        Mood and Affect: Mood normal.        Behavior: Behavior normal.        Thought Content: Thought content normal.        Judgment: Judgment normal.     Wt Readings from Last 3 Encounters:  10/15/23 160 lb 9.6 oz (72.8 kg)  07/14/23 158 lb 6.4 oz (71.8 kg)  06/27/23 156 lb (70.8 kg)          Assessment & Plan:  Decreased bone density Assessment & Plan: Incidental finding on CT  Ordering dexa scan pending results. Pt to f/u with PCP pending results.   Orders: -     DG Bone Density; Future  Chest wall pain Assessment & Plan: Still in constant pain with use of opiod as well as tylenol  in addition Advised on safety monitoring all modes of tylenol  intake with pain meds and otc not to exceed 3 g  Repeat right chest /rib xray today  Continue with back brace as with some improvement Conservative measures discussed  Orders: -     predniSONE ; Take as directed  Dispense: 1 each; Refill: 0 -     DG Ribs Unilateral W/Chest Right  Muscle spasm Assessment & Plan: Palpable spasm very tender Heat to site, blue emu (pt does not like biofreeze) Tizanidine  1/2 to one tablet po at bedtime prn muscle spasm, advised of s/e fatigue/drowsiness   Orders: -     predniSONE ; Take as directed  Dispense: 1 each; Refill: 0 -     tiZANidine  HCl; Take 1/2 to one tablet at bedtime prn muscle spasm  Dispense: 30 tablet; Refill: 0  Pulmonary nodule less than 6 mm determined by computed tomography of lung Assessment & Plan: Two nodules on 6 mm requiring close f/u  Will reach out to pulmonary to update on recent CT chest in ER (3-6 month recommended repeat)    Atherosclerosis of aorta  (HCC) Assessment & Plan: Incidental finding Strict lipid control  Reduce cvd risk factors.        Return for f/u PCP if no improvement in symptoms.  Ginger Patrick, MSN, APRN, FNP-C [facility]

## 2023-10-15 NOTE — Assessment & Plan Note (Signed)
 Incidental finding on CT  Ordering dexa scan pending results. Pt to f/u with PCP pending results.

## 2023-10-15 NOTE — Telephone Encounter (Signed)
 FYI pt seen in ER for rib/chest wall injury. In pain however controlled with pain meds. They did a CT chest, stable appearing from your CT 08/25/23. Radiologist did recommended 3-6 M repeat. Just wanted you to be aware . Weight stable.

## 2023-10-15 NOTE — Progress Notes (Signed)
 [facility] call and notify pt of stat results.  I want to repeat the CT thoracic spine as there is a compression deformities in the lower thoracic spine. Due to his pain I want to make sure it is not an acute fracture. We need to look again to make sure there is not a fracture that has developed after initial Ct.   Make sure he is not having any of the following:  severe low back pain. bladder dysfunction such as urinary retention or incontinence (loss of control) bowel incontinence (loss of control) muscle weakness or sensory loss in both legs. loss of motor function in legs (difficulty walking) If he starts with any of those symptoms he needs to go to Er.

## 2023-10-15 NOTE — Assessment & Plan Note (Signed)
 Palpable spasm very tender Heat to site, blue emu (pt does not like biofreeze) Tizanidine  1/2 to one tablet po at bedtime prn muscle spasm, advised of s/e fatigue/drowsiness

## 2023-10-15 NOTE — Assessment & Plan Note (Signed)
 Two nodules on 6 mm requiring close f/u  Will reach out to pulmonary to update on recent CT chest in ER (3-6 month recommended repeat)

## 2023-10-15 NOTE — Progress Notes (Signed)
 Saw your pt today, in a lot of pain even with hydrocodone . Reviewed ct thoracic spine and CT chest from ER visit however nothing that accounted to level of pain so ordered dedicated chest/right rib xray. No acute fracture however compression deformities thoracic spine, worry for acute fracture so repeating CT T spine. Pending results just want you to be aware of process.

## 2023-10-15 NOTE — Progress Notes (Signed)
Can you please put in stat order for CT thoracic w/o in urgent referrals after notifying pt of plan?

## 2023-10-17 ENCOUNTER — Ambulatory Visit
Admission: RE | Admit: 2023-10-17 | Discharge: 2023-10-17 | Disposition: A | Discharge status: Home / Self Care | Payer: Medicare Other | Source: Ambulatory Visit | Attending: Family

## 2023-10-17 DIAGNOSIS — S22069A Unspecified fracture of T7-T8 vertebra, initial encounter for closed fracture: Secondary | ICD-10-CM | POA: Diagnosis not present

## 2023-10-17 DIAGNOSIS — R9389 Abnormal findings on diagnostic imaging of other specified body structures: Secondary | ICD-10-CM | POA: Diagnosis not present

## 2023-10-17 DIAGNOSIS — M8580 Other specified disorders of bone density and structure, unspecified site: Secondary | ICD-10-CM | POA: Diagnosis not present

## 2023-10-17 DIAGNOSIS — M40204 Unspecified kyphosis, thoracic region: Secondary | ICD-10-CM | POA: Diagnosis not present

## 2023-10-17 DIAGNOSIS — M439 Deforming dorsopathy, unspecified: Secondary | ICD-10-CM | POA: Insufficient documentation

## 2023-10-17 DIAGNOSIS — S3992XA Unspecified injury of lower back, initial encounter: Secondary | ICD-10-CM | POA: Diagnosis not present

## 2023-10-17 DIAGNOSIS — I700 Atherosclerosis of aorta: Secondary | ICD-10-CM | POA: Diagnosis not present

## 2023-10-21 ENCOUNTER — Other Ambulatory Visit: Payer: Self-pay | Admitting: Family

## 2023-10-21 DIAGNOSIS — S22060A Wedge compression fracture of T7-T8 vertebra, initial encounter for closed fracture: Secondary | ICD-10-CM | POA: Insufficient documentation

## 2023-10-21 NOTE — Progress Notes (Signed)
Just to keep you in the loop Dr. Para March.  Referral placed for urgent neurosurgery referral Pt educated on cauda equine symptoms, pt currently with good pain control.

## 2023-10-21 NOTE — Progress Notes (Signed)
 FYI  There was progression of the compression fracture with the repeat CT thoracic spine.  I was out Thursday to Monday, but Adina did attempt to give him a call and left a VM for a call back. I have advised my pool to call him asap again for second attempt.

## 2023-10-21 NOTE — Progress Notes (Signed)
 Can we please give this pt a phone call again to touch base with him  There has been progression of the compression fracture which accounts for his worsening pain. The CT from 1/26 compared to 2/7 showed the progression.  I want to get him to spine doctor ASAP to discuss further. Likely continuing to wear the back brace would be the most important.   If he starts to notice any symptoms such as loss of bowel or bladder movements, tingling within the inner thighs and or lower extremities becoming weak and giving out then he needs to go to ER immediately.   Please let me know if he already sees a neuro surgeon or orthopedist otherwise I will initiate a stat referral.

## 2023-10-22 NOTE — Progress Notes (Unsigned)
Referring Physician:  Mort Sawyers, FNP [address] [place],  [place] [address]  Primary Physician:  Joaquim Nam, MD  History of Present Illness: 10/23/2023 [name] has a history of COPD, GERD,   Seen in ED on 10/05/23 with mid back pain that started on 10/03/23 when he was lifting hay bales. CT thoracic spine did showed chronic T7, T8, T9 vertebral height loss. Was given norco 5 and prednisone  He followed up with PCP on 10/15/23 with persistent mid back pain. Repeat CXR and thoracic CT showed progression of compression fracture at T8 along with more chronic inferior endplate fracture T7 that is stabe.   He is referred here for further treatment.   He has constant mid back pain that is worse on right. No radiation to his ribs. No arm or leg pain. Pain is more of a pressure feeling. He has been wearing an OTC brace with some improvement. Pain is a 9 to a 10 on a scale of 1-10. No numbness, tingling, or weakness. Pain is worse with prolonged sitting.   He is taking norco 5 and tizanidine with minimal relief. Was given steroid pack by PCP on 10/15/23.   He quit smoking in 2010.   Bowel/Bladder Dysfunction: none  The symptoms are causing a significant impact on the patient's life.   Review of Systems:  A 10 point review of systems is negative, except for the pertinent positives and negatives detailed in the HPI.  Past Medical History: Past Medical History:  Diagnosis Date   COPD (chronic obstructive pulmonary disease) (HCC)    GERD (gastroesophageal reflux disease)    Renal stones     Past Surgical History: Past Surgical History:  Procedure Laterality Date   HAND SURGERY Right     Allergies: Allergies as of 10/23/2023 - Review Complete 10/23/2023  Allergen Reaction Noted   Penicillins Rash 09/05/2015   Flexeril [cyclobenzaprine] Other (See Comments) 11/17/2017    Medications: Outpatient Encounter Medications as of 10/23/2023  Medication Sig    acetaminophen (TYLENOL) 325 MG tablet Take 325 mg by mouth 3 (three) times daily as needed.   albuterol (VENTOLIN HFA) 108 (90 Base) MCG/ACT inhaler USE 1 TO 2 INHALATIONS BY MOUTH  EVERY 6 HOURS AS NEEDED FOR  SHORTNESS OF BREATH OR WHEEZING   Budeson-Glycopyrrol-Formoterol (BREZTRI AEROSPHERE) 160-9-4.8 MCG/ACT AERO Inhale 2 puffs into the lungs in the morning and at bedtime.   Budeson-Glycopyrrol-Formoterol (BREZTRI AEROSPHERE) 160-9-4.8 MCG/ACT AERO Inhale 2 puffs into the lungs in the morning and at bedtime.   fluticasone (FLONASE) 50 MCG/ACT nasal spray Place 2 sprays into both nostrils daily as needed for allergies or rhinitis.   HYDROcodone-acetaminophen (NORCO/VICODIN) 5-325 MG tablet Take 1 tablet by mouth every 6 (six) hours as needed.   ibuprofen (ADVIL) 200 MG tablet Take 200 mg by mouth 3 (three) times daily as needed (with food.).   Multiple Vitamin (MULTIVITAMIN) tablet Take 1 tablet by mouth daily.   STIOLTO RESPIMAT 2.5-2.5 MCG/ACT AERS USE 2 INHALATIONS BY MOUTH ONCE  DAILY IN THE MORNING   tiZANidine (ZANAFLEX) 4 MG tablet Take 1/2 to one tablet at bedtime prn muscle spasm   triamcinolone cream (KENALOG) 0.5 % Apply 1 Application topically 2 (two) times daily as needed. Only use as needed on affected area   HYDROcodone-acetaminophen (NORCO/VICODIN) 5-325 MG tablet Take 1 tablet by mouth every 6 (six) hours as needed (for pain.  sedation caution). (Patient not taking: Reported on 10/23/2023)   predniSONE (STERAPRED  UNI-PAK 21 TAB) 10 MG (21) TBPK tablet Take as directed (Patient not taking: Reported on 10/23/2023)   Spacer/Aero-Holding Chambers (AEROCHAMBER MV) inhaler Use as instructed (Patient not taking: Reported on 10/23/2023)   tamsulosin (FLOMAX) 0.4 MG CAPS capsule Take 1 capsule (0.4 mg total) by mouth daily as needed. (Patient not taking: Reported on 10/23/2023)   Tiotropium Bromide-Olodaterol (STIOLTO RESPIMAT) 2.5-2.5 MCG/ACT AERS Inhale 2 puffs into the lungs daily. (Patient  not taking: Reported on 10/23/2023)   No facility-administered encounter medications on file as of 10/23/2023.    Social History: Social History   Tobacco Use   Smoking status: Former    Current packs/day: 0.00    Average packs/day: 1 pack/day for 40.0 years (40.0 ttl pk-yrs)    Types: Cigarettes    Start date: 09/09/1968    Quit date: 09/09/2008    Years since quitting: 15.1   Smokeless tobacco: Never  Substance Use Topics   Alcohol use: No    Alcohol/week: 0.0 standard drinks of alcohol    Comment: Very rare   Drug use: Never    Family Medical History: Family History  Problem Relation Age of Onset   COPD Mother    COPD Father    Prostate cancer Paternal Uncle    Coronary artery disease Brother 67       multiple stents   Colon cancer Neg Hx     Physical Examination: Vitals:   10/23/23 1007  BP: 110/60    General: Patient is well developed, well nourished, calm, collected, and in no apparent distress. Attention to examination is appropriate.  Respiratory: Patient is breathing without any difficulty.   NEUROLOGICAL:     Awake, alert, oriented to person, place, and time.  Speech is clear and fluent. Fund of knowledge is appropriate.   Cranial Nerves: Pupils equal round and reactive to light.  Facial tone is symmetric.    He has tenderness around T8  No abnormal lesions on exposed skin.   Strength: Side Biceps Triceps Deltoid Interossei Grip Wrist Ext. Wrist Flex.  R 5 5 5 5 5 5 5   L 5 5 5 5 5 5 5    Side Iliopsoas Quads Hamstring PF DF EHL  R 5 5 5 5 5 5   L 5 5 5 5 5 5    Reflexes are 2+ and symmetric at the biceps, brachioradialis, patella and achilles.   Hoffman's is absent.  Clonus is not present.   Bilateral upper and lower extremity sensation is intact to light touch.     Gait is normal.    Medical Decision Making  Imaging: CT of thoracic spine dated 10/17/23:  FINDINGS: Alignment: No significant listhesis is present. Progressive thoracic kyphosis  is present.   Vertebrae: A progressive compression fracture is present at T8. The vertebral body is now reduced to 9 mm anteriorly in the midline. This compares with 13 mm on the prior thoracic spine CT and 16 mm on the CT chest 08/14/2023. A more chronic inferior endplate fracture at T7 is stable. No other acute fractures are present. Moderate osteopenia is present throughout the thoracic spine. No discrete lesions are present.   Paraspinal and other soft tissues: Atherosclerotic calcifications are present in the aorta branch vessels. Calcifications are present at the aortic arch. Coronary artery calcifications are present. Emphysematous changes are present. No nodule or mass lesion is present. Airspace disease is present. No significant pleural effusion or pneumothorax is present.   Disc levels: New bilateral foraminal narrowing is present at  T8-9, likely secondary to the kyphosis. No other focal disc protrusion or stenosis is present.   IMPRESSION: 1. Progressive compression fracture at T8 since 10/05/2023. The vertebral body is now reduced to 9 mm anteriorly in the midline. This compares with 13 mm on the prior exam 16 mm on a chest CT 08/14/2023. 2. Stable inferior endplate fracture at T7. 3. New bilateral foraminal narrowing at T8-9, likely secondary to the kyphosis. 4. Aortic Atherosclerosis (ICD10-I70.0) and Emphysema (ICD10-J43.9).     Electronically Signed   By: Marin Roberts M.D.   On: 10/17/2023 11:44    I have personally reviewed the images and agree with the above interpretation.  Assessment and Plan: Mr.  has mid back pain that started on 10/03/23 when he was lifting hay bales. Was found to have T8 compression fracture.   He has constant mid back pain that is worse on right. No radiation to his ribs. No arm or leg pain. No numbness, tingling, or weakness.   CT scan from 10/17/23 shows T8 compression fracture. Clinically he looks good.    Treatment options discussed with patient and following plan made:   - Will get TLSO brace at [facility]. Orders sent.  - Wear brace when up and walking. Can wear when sitting if comfortable. Do not wear to bed.  - No bending, twisting, or lifting.  - Stop hydrocodone. New prescription for oxycodone. Reviewed dosing and side effects. Take with food.  - Continue prn zanaflex for muscle spasms. Reviewed doisnga nd side effects. He knows this can make him sleepy.  - Discussed possible kyphoplasty. May revisit if no improvement with above.  - Follow up with me in 4-5 weeks. Will need xrays prior to that visit.   I spent a total of 35 minutes in face-to-face and non-face-to-face activities related to this patient's care today including review of outside records, review of imaging, review of symptoms, physical exam, discussion of differential diagnosis, discussion of treatment options, and documentation.   Thank you for involving me in the care of this patient.   Drake Leach PA-C Dept. of Neurosurgery

## 2023-10-23 ENCOUNTER — Ambulatory Visit (INDEPENDENT_AMBULATORY_CARE_PROVIDER_SITE_OTHER): Payer: Medicare Other | Admitting: Orthopedic Surgery

## 2023-10-23 ENCOUNTER — Encounter: Payer: Self-pay | Admitting: Orthopedic Surgery

## 2023-10-23 VITALS — BP 110/60 | Ht 64.0 in | Wt 161.0 lb

## 2023-10-23 DIAGNOSIS — S22060A Wedge compression fracture of T7-T8 vertebra, initial encounter for closed fracture: Secondary | ICD-10-CM | POA: Diagnosis not present

## 2023-10-23 MED ORDER — OXYCODONE HCL 5 MG PO TABS: 5.0000 mg | ORAL_TABLET | Freq: Four times a day (QID) | ORAL | 0 refills | Status: DC | PRN

## 2023-10-23 NOTE — Patient Instructions (Signed)
It was so nice to see you today. Thank you so much for coming in.    You have a broken bone at T8 and this is causing your pain. This should heal without surgery.   I sent an order for a brace to the [facility]. They should call you. See their card.   Wear brace when you are up and walking. Do not wear to bed. Can remove when sitting if more comfortable.   No bending, twisting, or lifting.   Stop the hydrocodone. I sent oxycodone to your pharmacy to take for severe pain. Take only as needed. This can make you sleepy and/or constipated.   Okay to continue on tizanidine as needed to help with muscle spasms. Use only as needed and be careful, this can make you sleepy.   I will see you back in 4-5 weeks. Get xrays prior to your visit with me.   Please do not hesitate to call if you have any questions or concerns. You can also message me in MyChart.    Drake Leach PA-C [phone]     The physicians and staff at [facility] at [facility] are committed to providing excellent care. You may receive a survey asking for feedback about your experience at our office. We value you your feedback and appreciate you taking the time to to fill it out. The [facility] leadership team is also available to discuss your experience in person, feel free to contact us [phone].

## 2023-10-24 ENCOUNTER — Encounter: Payer: Self-pay | Admitting: Family Medicine

## 2023-10-24 ENCOUNTER — Ambulatory Visit (INDEPENDENT_AMBULATORY_CARE_PROVIDER_SITE_OTHER): Payer: Medicare Other | Admitting: Family Medicine

## 2023-10-24 VITALS — BP 116/58 | HR 67 | Temp 97.9°F | Ht 64.0 in | Wt 161.6 lb

## 2023-10-24 DIAGNOSIS — I2510 Atherosclerotic heart disease of native coronary artery without angina pectoris: Secondary | ICD-10-CM | POA: Diagnosis not present

## 2023-10-24 DIAGNOSIS — N210 Calculus in bladder: Secondary | ICD-10-CM | POA: Diagnosis not present

## 2023-10-24 DIAGNOSIS — S22000A Wedge compression fracture of unspecified thoracic vertebra, initial encounter for closed fracture: Secondary | ICD-10-CM

## 2023-10-24 DIAGNOSIS — S22060A Wedge compression fracture of T7-T8 vertebra, initial encounter for closed fracture: Secondary | ICD-10-CM

## 2023-10-24 LAB — VITAMIN D 25 HYDROXY (VIT D DEFICIENCY, FRACTURES): VITD: 32.98 ng/mL (ref 30.00–100.00)

## 2023-10-24 MED ORDER — HYDROCODONE-ACETAMINOPHEN 5-325 MG PO TABS: 0.5000 | ORAL_TABLET | Freq: Four times a day (QID) | ORAL | Status: DC | PRN

## 2023-10-24 NOTE — Progress Notes (Signed)
Order has been faxed to [facility].

## 2023-10-24 NOTE — Progress Notes (Signed)
CAD noted on imaging.  D/w pt about incidental findings.  Having pain from his back but not exertional/typical CP.  Discussed addressing this after his back issues were resolved.  Back pain was more of a pressing issue.  He is not smoking.  T8 fx. serial CT reviewed with patient.  Images reviewed.  He didn't tolerate oxycodone but he could tolerate hydrocodone. D/w pt about options.  Nauseated with oxycodone use.  He does get some pain relief with hydrocodone.  Not sedated.  Taking 1/2 hydrocodone with advil per dose and he could tolerate that.  He has some hydrocodone to use, taking 2 tabs of hydrocodone per day.    He also has a bladder stone with occ pain from that.  Discussed.  Meds, vitals, and allergies reviewed.   ROS: Per HPI unless specifically indicated in ROS section   Nad [place] Neck supple, no LA Rrr Ctab Abd with normal BS Wearing back brace at office visit. Strength intact with the lower extremities and able to bear weight.  See after visit summary.  32 minutes were devoted to patient care in this encounter (this includes time spent reviewing the patient's file/history, interviewing and examining the patient, counseling/reviewing plan with patient).

## 2023-10-24 NOTE — Patient Instructions (Addendum)
Take hydrocodone with advil- take with food.  Sedation caution.  Stop oxycodone.  Update me Monday.  I'll update the spine clinic.  Go to the lab on the way out.   If you have mychart we'll likely use that to update you.    Take care.  Glad to see you. TLSO brace at [facility] - [phone]  Check with urology when you can.

## 2023-10-26 ENCOUNTER — Other Ambulatory Visit: Payer: Self-pay | Admitting: Family Medicine

## 2023-10-26 DIAGNOSIS — S22000A Wedge compression fracture of unspecified thoracic vertebra, initial encounter for closed fracture: Secondary | ICD-10-CM

## 2023-10-26 DIAGNOSIS — I2510 Atherosclerotic heart disease of native coronary artery without angina pectoris: Secondary | ICD-10-CM | POA: Insufficient documentation

## 2023-10-26 NOTE — Assessment & Plan Note (Signed)
I asked him to check with urology when possible.

## 2023-10-26 NOTE — Assessment & Plan Note (Addendum)
He'll use hydrocodone and I'll update spine clinic.   I thank all involved. He has hydrocodone to use in the meantime.  I can refill that in the future if needed.  He can update me Monday.  If he is not getting adequate pain control we may need spine clinic input regarding potential kyphoplasty. Check vit D.  Needs DXA when possible.  Not smoking.  Discussed.

## 2023-10-26 NOTE — Assessment & Plan Note (Signed)
Not having exertional/typical CP.  Discussed addressing CAD after his back issues were resolved.  Back pain was more of a pressing issue.  He is not smoking.

## 2023-10-27 ENCOUNTER — Telehealth: Payer: Self-pay

## 2023-10-27 ENCOUNTER — Other Ambulatory Visit: Payer: Self-pay | Admitting: Family Medicine

## 2023-10-27 MED ORDER — HYDROCODONE-ACETAMINOPHEN 5-325 MG PO TABS: 0.5000 | ORAL_TABLET | Freq: Four times a day (QID) | ORAL | 0 refills | Status: DC | PRN

## 2023-10-27 NOTE — Telephone Encounter (Signed)
 Marland Kitchen

## 2023-10-27 NOTE — Telephone Encounter (Signed)
I re-faxed the order. Please contact [facility] to make sure they received it. Then please update the patient. Thank you.

## 2023-10-28 NOTE — Telephone Encounter (Signed)
I called and spoke to [facility]. The order was received and she has already left the patient a message this morning.

## 2023-10-29 ENCOUNTER — Telehealth: Payer: Self-pay

## 2023-10-29 NOTE — Telephone Encounter (Addendum)
PDMP reviewed and aware.  He has vertebral fracture and plan for ongoing pain treatment.  Thanks for the call.  Appreciate help of all involved.

## 2023-10-29 NOTE — Telephone Encounter (Signed)
Copied from CRM [id number]. Topic: Clinical - Prescription Issue >> Oct 29, 2023  2:30 PM Gurney Maxin H wrote: Reason for CRM: Oge is calling to get clarification for the HYDROcodone-acetaminophen (NORCO/VICODIN) 5-325 MG tablet, he's had this medication refilled by 3 different providers in the last 2 weeks and wanted to notify provider.  [facility] [phone]

## 2023-10-31 ENCOUNTER — Ambulatory Visit: Payer: Self-pay | Admitting: Family Medicine

## 2023-10-31 NOTE — Telephone Encounter (Signed)
Spoke with Kathlene November at the pharmacy and is aware that its okay to fill the rx.

## 2023-10-31 NOTE — Telephone Encounter (Signed)
Patient notified that [facility] will fill the prescription.

## 2023-10-31 NOTE — Telephone Encounter (Signed)
Copied from CRM [id number]. Topic: Clinical - Prescription Issue >> Oct 31, 2023 11:41 AM Thomes Dinning wrote: Reason for CRM: Patient states he was informed by the pharmacy he would not be able to pick up his HYDROcodone-acetaminophen (NORCO/VICODIN) 5-325 MG tablet. Patient is asking that the medication be sent to the [facility] pharmacy.

## 2023-11-05 ENCOUNTER — Ambulatory Visit: Payer: Medicare Other | Admitting: Pulmonary Disease

## 2023-11-05 DIAGNOSIS — M546 Pain in thoracic spine: Secondary | ICD-10-CM | POA: Diagnosis not present

## 2023-11-05 DIAGNOSIS — M8589 Other specified disorders of bone density and structure, multiple sites: Secondary | ICD-10-CM | POA: Diagnosis not present

## 2023-11-05 DIAGNOSIS — S22060A Wedge compression fracture of T7-T8 vertebra, initial encounter for closed fracture: Secondary | ICD-10-CM | POA: Diagnosis not present

## 2023-11-05 DIAGNOSIS — S22060D Wedge compression fracture of T7-T8 vertebra, subsequent encounter for fracture with routine healing: Secondary | ICD-10-CM | POA: Diagnosis not present

## 2023-11-05 DIAGNOSIS — S22060S Wedge compression fracture of T7-T8 vertebra, sequela: Secondary | ICD-10-CM | POA: Diagnosis not present

## 2023-11-13 ENCOUNTER — Telehealth: Payer: Self-pay | Admitting: Pulmonary Disease

## 2023-11-13 NOTE — Telephone Encounter (Signed)
 Pt states he fractured his back and is not sure if he will be able to successfully complete his PFT. Wants to know Dr. Candis Musa thoughts

## 2023-11-18 NOTE — Progress Notes (Deleted)
 Referring Physician:  Joaquim Nam, MD [address] [place],  [place] [address]  Primary Physician:  Joaquim Nam, MD  History of Present Illness: 11/18/2023 [name] has a history of COPD, GERD, CAD.   Last seen by me on 10/23/23 for T8 compression fracture that likely occurred on 10/03/23 when lifting hay bales.   He was placed in TLSO brace after his last visit. He is here for follow up and repeat xrays.     He has constant mid back pain that is worse on right. No radiation to his ribs. No arm or leg pain. Pain is more of a pressure feeling. He has been wearing an OTC brace with some improvement. Pain is a 9 to a 10 on a scale of 1-10. No numbness, tingling, or weakness. Pain is worse with prolonged sitting.   He is taking norco 5 and tizanidine with minimal relief. Was given steroid pack by PCP on 10/15/23.   He quit smoking in 2010.   Bowel/Bladder Dysfunction: none  The symptoms are causing a significant impact on the patient's life.   Review of Systems:  A 10 point review of systems is negative, except for the pertinent positives and negatives detailed in the HPI.  Past Medical History: Past Medical History:  Diagnosis Date   COPD (chronic obstructive pulmonary disease) (HCC)    GERD (gastroesophageal reflux disease)    Renal stones     Past Surgical History: Past Surgical History:  Procedure Laterality Date   HAND SURGERY Right     Allergies: Allergies as of 11/25/2023 - Review Complete 10/24/2023  Allergen Reaction Noted   Penicillins Rash 09/05/2015   Flexeril [cyclobenzaprine] Other (See Comments) 11/17/2017    Medications: Outpatient Encounter Medications as of 11/25/2023  Medication Sig   acetaminophen (TYLENOL) 325 MG tablet Take 325 mg by mouth 3 (three) times daily as needed.   albuterol (VENTOLIN HFA) 108 (90 Base) MCG/ACT inhaler USE 1 TO 2 INHALATIONS BY MOUTH  EVERY 6 HOURS AS NEEDED FOR  SHORTNESS OF BREATH OR WHEEZING    Budeson-Glycopyrrol-Formoterol (BREZTRI AEROSPHERE) 160-9-4.8 MCG/ACT AERO Inhale 2 puffs into the lungs in the morning and at bedtime.   Budeson-Glycopyrrol-Formoterol (BREZTRI AEROSPHERE) 160-9-4.8 MCG/ACT AERO Inhale 2 puffs into the lungs in the morning and at bedtime.   fluticasone (FLONASE) 50 MCG/ACT nasal spray Place 2 sprays into both nostrils daily as needed for allergies or rhinitis.   HYDROcodone-acetaminophen (NORCO/VICODIN) 5-325 MG tablet Take 0.5 tablets by mouth every 6 (six) hours as needed (for pain.  sedation caution).   ibuprofen (ADVIL) 200 MG tablet Take 200 mg by mouth 3 (three) times daily as needed (with food.).   Multiple Vitamin (MULTIVITAMIN) tablet Take 1 tablet by mouth daily.   Spacer/Aero-Holding Chambers (AEROCHAMBER MV) inhaler Use as instructed (Patient not taking: Reported on 10/24/2023)   STIOLTO RESPIMAT 2.5-2.5 MCG/ACT AERS USE 2 INHALATIONS BY MOUTH ONCE  DAILY IN THE MORNING (Patient not taking: Reported on 10/24/2023)   tamsulosin (FLOMAX) 0.4 MG CAPS capsule Take 1 capsule (0.4 mg total) by mouth daily as needed. (Patient not taking: Reported on 10/24/2023)   Tiotropium Bromide-Olodaterol (STIOLTO RESPIMAT) 2.5-2.5 MCG/ACT AERS Inhale 2 puffs into the lungs daily. (Patient not taking: Reported on 10/24/2023)   tiZANidine (ZANAFLEX) 4 MG tablet Take 1/2 to one tablet at bedtime prn muscle spasm   triamcinolone cream (KENALOG) 0.5 % Apply 1 Application topically 2 (two) times daily as needed. Only use as needed on  affected area   No facility-administered encounter medications on file as of 11/25/2023.    Social History: Social History   Tobacco Use   Smoking status: Former    Current packs/day: 0.00    Average packs/day: 1 pack/day for 40.0 years (40.0 ttl pk-yrs)    Types: Cigarettes    Start date: 09/09/1968    Quit date: 09/09/2008    Years since quitting: 15.2   Smokeless tobacco: Never  Substance Use Topics   Alcohol use: No    Alcohol/week: 0.0  standard drinks of alcohol    Comment: Very rare   Drug use: Never    Family Medical History: Family History  Problem Relation Age of Onset   COPD Mother    COPD Father    Prostate cancer Paternal Uncle    Coronary artery disease Brother 45       multiple stents   Colon cancer Neg Hx     Physical Examination: There were no vitals filed for this visit.  Awake, alert, oriented to person, place, and time.  Speech is clear and fluent. Fund of knowledge is appropriate.   Cranial Nerves: Pupils equal round and reactive to light.  Facial tone is symmetric.    He has tenderness around T8  No abnormal lesions on exposed skin.   Strength: Side Biceps Triceps Deltoid Interossei Grip Wrist Ext. Wrist Flex.  R 5 5 5 5 5 5 5   L 5 5 5 5 5 5 5    Side Iliopsoas Quads Hamstring PF DF EHL  R 5 5 5 5 5 5   L 5 5 5 5 5 5    Reflexes are 2+ and symmetric at the biceps, brachioradialis, patella and achilles.   Hoffman's is absent.  Clonus is not present.   Bilateral upper and lower extremity sensation is intact to light touch.     Gait is normal.    Medical Decision Making  Imaging: Thoracic xrays dated ***:  ***  Report for above xrays not yet available.   Assessment and Plan: [name] has mid back pain that started on 10/03/23 when he was lifting hay bales. Was found to have T8 compression fracture.   He has constant mid back pain that is worse on right. No radiation to his ribs. No arm or leg pain. No numbness, tingling, or weakness.   CT scan from 10/17/23 shows T8 compression fracture. Clinically he looks good.   Treatment options discussed with patient and following plan made:   - Will get TLSO brace at [facility]. Orders sent.  - Wear brace when up and walking. Can wear when sitting if comfortable. Do not wear to bed.  - No bending, twisting, or lifting.  - Stop hydrocodone. New prescription for oxycodone. Reviewed dosing and side effects. Take with food.  - Continue  prn zanaflex for muscle spasms. Reviewed doisnga nd side effects. He knows this can make him sleepy.  - Discussed possible kyphoplasty. May revisit if no improvement with above.  - Follow up with me in 4-5 weeks. Will need xrays prior to that visit.   I spent a total of 35 minutes in face-to-face and non-face-to-face activities related to this patient's care today including review of outside records, review of imaging, review of symptoms, physical exam, discussion of differential diagnosis, discussion of treatment options, and documentation.   Thank you for involving me in the care of this patient.   Drake Leach PA-C Dept. of Neurosurgery

## 2023-11-21 NOTE — Telephone Encounter (Signed)
 I spoke with the patient. He would like to cancel the PFT that is scheduled for 3/27.  I have canceled the appt with Dr. Larinda Buttery scheduled for the same day.  Synetta Fail, can you cancel the PFT and reschedule him once you get more appts. Thank you!

## 2023-11-24 ENCOUNTER — Other Ambulatory Visit: Payer: Self-pay | Admitting: Family Medicine

## 2023-11-24 NOTE — Telephone Encounter (Unsigned)
 Copied from CRM [id number]. Topic: General - Other >> Nov 24, 2023 11:45 AM Alcus Dad wrote: Reason for CRM: Patient wants Dr. Lianne Bushy nurse to give him a call back for pain med refill for broken back

## 2023-11-24 NOTE — Telephone Encounter (Unsigned)
 Copied from CRM [id number]. Topic: Clinical - Medication Refill >> Nov 24, 2023 11:47 AM Alcus Dad wrote: Most Recent Primary Care Visit:  Provider: Joaquim Nam  Department: [facility]  Visit Type: OFFICE VISIT  Date: 10/24/2023  Medication: HYDROcodone-acetaminophen (NORCO/VICODIN) 5-325 MG tablet  Has the patient contacted their pharmacy? No (Agent: If no, request that the patient contact the pharmacy for the refill. If patient does not wish to contact the pharmacy document the reason why and proceed with request.) (Agent: If yes, when and what did the pharmacy advise?)  Is this the correct pharmacy for this prescription? Yes If no, delete pharmacy and type the correct one.  This is the patient's preferred pharmacy:  [facility] [place], [place] - [address] [place] [address] Phone: [phone] Fax: [phone]   Has the prescription been filled recently? No  Is the patient out of the medication? No  Has the patient been seen for an appointment in the last year OR does the patient have an upcoming appointment? Yes  Can we respond through MyChart? Yes  Agent: Please be advised that Rx refills may take up to 3 business days. We ask that you follow-up with your pharmacy.

## 2023-11-25 ENCOUNTER — Ambulatory Visit: Payer: Medicare Other | Admitting: Orthopedic Surgery

## 2023-11-25 MED ORDER — HYDROCODONE-ACETAMINOPHEN 5-325 MG PO TABS: 0.5000 | ORAL_TABLET | Freq: Four times a day (QID) | ORAL | 0 refills | Status: DC | PRN

## 2023-11-25 NOTE — Telephone Encounter (Unsigned)
 Copied from CRM [id number]. Topic: Clinical - Prescription Issue >> Nov 25, 2023  9:18 AM Elizebeth Brooking wrote: Reason for CRM: Patient called in about his HYDROcodone-acetaminophen (NORCO/VICODIN) 5-325 MG tablet , informed patient it is pending and being worked on and the timeframe for refill request. Patient wanted to know what is he suppose to do in the meantime while waiting for his medication

## 2023-11-25 NOTE — Telephone Encounter (Signed)
  I sent the rx.   I get the point about his back and I hope he feels better.  If he can give [place] a little more lead time on his refills (especially around a weekend) we can work on that.    Thanks.

## 2023-11-26 ENCOUNTER — Ambulatory Visit: Admitting: Orthopedic Surgery

## 2023-11-27 ENCOUNTER — Ambulatory Visit: Payer: Medicare Other | Admitting: Pulmonary Disease

## 2023-11-27 NOTE — Telephone Encounter (Signed)
 I have now CXL Mr. Radigan PFT appt but was able to give it to someone else

## 2023-12-04 ENCOUNTER — Ambulatory Visit: Payer: Medicare Other

## 2023-12-04 ENCOUNTER — Ambulatory Visit: Payer: Medicare Other | Admitting: Pulmonary Disease

## 2023-12-25 ENCOUNTER — Other Ambulatory Visit: Payer: Self-pay | Admitting: Family Medicine

## 2023-12-25 NOTE — Telephone Encounter (Signed)
 Copied from CRM [id number]. Topic: Clinical - Medication Refill >> Dec 25, 2023  9:51 AM Jenice Mitts wrote: Most Recent Primary Care Visit:  Provider: Donnie Galea  Department: [facility]  Visit Type: OFFICE VISIT  Date: 10/24/2023  Medication: HYDROcodone-acetaminophen (NORCO/VICODIN) 5-325 MG tablet  Has the patient contacted their pharmacy? Yes (Agent: If no, request that the patient contact the pharmacy for the refill. If patient does not wish to contact the pharmacy document the reason why and proceed with request.) (Agent: If yes, when and what did the pharmacy advise?)  Is this the correct pharmacy for this prescription? Yes If no, delete pharmacy and type the correct one.  This is the patient's preferred pharmacy:  [facility] - [place], [place] - [address] [place] [address] Phone: [phone] Fax: [phone]   Has the prescription been filled recently? Yes  Is the patient out of the medication? Yes  Has the patient been seen for an appointment in the last year OR does the patient have an upcoming appointment? Yes  Can we respond through MyChart? Yes  Agent: Please be advised that Rx refills may take up to 3 business days. We ask that you follow-up with your pharmacy.

## 2023-12-26 ENCOUNTER — Ambulatory Visit: Payer: Self-pay

## 2023-12-26 NOTE — Telephone Encounter (Addendum)
  Chief Complaint: SOB Symptoms: back pain  Disposition: [x] ED /[] Urgent Care (no appt availability in office) / [] Appointment(In office/virtual)/ []  [facility] [facility]/ [] Home Care/ [x] Refused Recommended Disposition /[] [place] [facility]/ []  Follow-up with PCP Additional Notes: Pt called with worsening Sob due to back pain from broken back. Pt has been on  Norco and ran out 3 days ago. Pt stated he called office yesterday to seek help and didn't receive call back. Med refill request was placed.  Pt stated the back pain was so intense upon waking up that he couldn't use inhaler. "I about passed out. I can't take a deep breath. I just hurts too much. I was finally able to get enough of my inhaler in me that my breathing is some what better but I can't keep doing this. I can barely move." Pt rated pain 8/10. RN advised put to go to ED and pt refused. Pt wants Dr Vallarie Gauze to call him.         Copied from CRM [id number]. Topic: Clinical - Red Word Triage >> Dec 26, 2023 11:17 AM Luane Rumps D wrote: Red Word that prompted transfer to Nurse Triage: Back pain so bad he cannot breathe through inhaler. Reason for Disposition  [1] MODERATE difficulty breathing (e.g., speaks in phrases, SOB even at rest, pulse 100-120) AND [2] NEW-onset or WORSE than normal  Answer Assessment - Initial Assessment Questions 1. RESPIRATORY STATUS: "Describe your breathing?" (e.g., wheezing, shortness of breath, unable to speak, severe coughing)      Can't take breath in 2. ONSET: "When did this breathing problem begin?"      3 days 3. PATTERN "Does the difficult breathing come and go, or has it been constant since it started?"      Constant  4. SEVERITY: "How bad is your breathing?" (e.g., mild, moderate, severe)    - MILD: No SOB at rest, mild SOB with walking, speaks normally in sentences, can lie down, no retractions, pulse < 100.    - MODERATE: SOB at rest, SOB with minimal exertion and prefers to sit, cannot  lie down flat, speaks in phrases, mild retractions, audible wheezing, pulse 100-120.    - SEVERE: Very SOB at rest, speaks in single words, struggling to breathe, sitting hunched forward, retractions, pulse > 120      moderate 5. RECURRENT SYMPTOM: "Have you had difficulty breathing before?" If Yes, ask: "When was the last time?" and "What happened that time?"      Yes broken back- hard to breathe  7. LUNG HISTORY: "Do you have any history of lung disease?"  (e.g., pulmonary embolus, asthma, emphysema)     Emphysema  8. CAUSE: "What do you think is causing the breathing problem?"      Back pain 9. OTHER SYMPTOMS: "Do you have any other symptoms? (e.g., dizziness, runny nose, cough, chest pain, fever)     Back pain- 8  Protocols used: Breathing Difficulty-A-AH

## 2023-12-29 ENCOUNTER — Encounter: Payer: Self-pay | Admitting: *Deleted

## 2023-12-29 MED ORDER — HYDROCODONE-ACETAMINOPHEN 5-325 MG PO TABS: 0.5000 | ORAL_TABLET | Freq: Four times a day (QID) | ORAL | 0 refills | Status: DC | PRN

## 2023-12-29 NOTE — Telephone Encounter (Signed)
 This came to me today.  See phone note.

## 2023-12-29 NOTE — Telephone Encounter (Signed)
 LOV 10/24/23  NOV nothing scheduled  Last refill 11/25/23 # 20 w/ 0 refills

## 2023-12-29 NOTE — Telephone Encounter (Signed)
 Copied from CRM [id number]. Topic: Clinical - Red Word Triage >> Dec 29, 2023  1:43 PM Marlan Silva wrote: Red Word that prompted transfer to Nurse Triage: Back pain so bad he cannot breathe when he inhales.. This encounter was created in error - please disregard.

## 2023-12-29 NOTE — Telephone Encounter (Signed)
 Noted. Thanks.

## 2023-12-29 NOTE — Telephone Encounter (Signed)
 This note was from the 18th when the clinic was closed.  I sent the rx today when I got his this note.  Please update patient and see about his status.  If his pain is not getting any better, then please see about getting him in clinic when possible.  Thanks.

## 2023-12-29 NOTE — Telephone Encounter (Signed)
  Chief Complaint: back pain persists and causes issues with using inhaler every am.  Symptoms: pain level 7-8/10 taking tylenol  and motrin  with little relief. Sleeping issues due to middle back pain.  Frequency: over weekend Pertinent Negatives: Patient denies chest pain no difficulty breathing no N/T down legs Disposition: [] ED /[] Urgent Care (no appt availability in office) / [x] Appointment(In office/virtual)/ []  [place] [facility]/ [] Home Care/ [] Refused Recommended Disposition /[] [place] [facility]/ []  Follow-up with PCP Additional Notes:   Reviewed message from PCP today  Rx sent to pharmacy. Appt scheduled for earliest 01/02/24 with PCP. Recommended if pain worsens go to ED.

## 2024-01-02 ENCOUNTER — Ambulatory Visit: Admitting: Family Medicine

## 2024-01-02 ENCOUNTER — Encounter: Payer: Self-pay | Admitting: Family Medicine

## 2024-01-02 ENCOUNTER — Ambulatory Visit
Admission: RE | Admit: 2024-01-02 | Discharge: 2024-01-02 | Disposition: A | Discharge status: Home / Self Care | Source: Ambulatory Visit | Attending: Family Medicine | Admitting: Family Medicine

## 2024-01-02 VITALS — BP 142/78 | HR 69 | Temp 98.0°F | Ht 64.0 in | Wt 165.8 lb

## 2024-01-02 DIAGNOSIS — M62838 Other muscle spasm: Secondary | ICD-10-CM

## 2024-01-02 DIAGNOSIS — S22060D Wedge compression fracture of T7-T8 vertebra, subsequent encounter for fracture with routine healing: Secondary | ICD-10-CM | POA: Diagnosis not present

## 2024-01-02 DIAGNOSIS — M4854XA Collapsed vertebra, not elsewhere classified, thoracic region, initial encounter for fracture: Secondary | ICD-10-CM | POA: Diagnosis not present

## 2024-01-02 DIAGNOSIS — R911 Solitary pulmonary nodule: Secondary | ICD-10-CM | POA: Diagnosis not present

## 2024-01-02 MED ORDER — TIZANIDINE HCL 4 MG PO TABS: ORAL_TABLET | ORAL | 0 refills | Status: DC

## 2024-01-02 MED ORDER — HYDROCODONE-ACETAMINOPHEN 5-325 MG PO TABS: 1.0000 | ORAL_TABLET | Freq: Three times a day (TID) | ORAL | 0 refills | Status: DC | PRN

## 2024-01-02 NOTE — Progress Notes (Signed)
 Follow up for back pain.  Known fracture history.  No ADE on med. Tizanidine  helped.  Hydrocodone  helped more than oxycodone .  Still using his thoracic brace.    He failed taper off pain meds.  Pain with inhalation when he was off pain meds.    He is taking 1/2 tab of hydrocodone  about 3-4 times per day but still having pain.  He is taking Tylenol  with vicodin.  D/w pt.   He cut out pork, chocolate, and cinnamon.  His hand irritation improved in the meantime.   He passed a stone in his urine, presumed to be his bladder stone or at least part of it.    D/w pt about getting his f/u chest CT.  He is seeing pulmonary.    Meds, vitals, and allergies reviewed.   ROS: Per HPI unless specifically indicated in ROS section   Nad Ncat In thoracic brace. Rrr Ctab but pain with laugh, deep breath. Abdomen soft. Skin well-perfused.

## 2024-01-02 NOTE — Patient Instructions (Addendum)
 Go to the lab on the way out.   If you have mychart we'll likely use that to update you.    Please call about follow up at [facility] at [facility] [phone]  Please call about the follow up lung/chest CT with [facility] at [facility] [phone].  Update me in about 1 week, sooner if needed.   Take care.  Glad to see you.

## 2024-01-04 ENCOUNTER — Telehealth: Payer: Self-pay | Admitting: Family Medicine

## 2024-01-04 NOTE — Assessment & Plan Note (Signed)
 I asked him tocall about the follow up lung/chest CT with [facility] at [facility].

## 2024-01-04 NOTE — Telephone Encounter (Signed)
 Please update patient.  I see his old fracture on the follow-up x-ray.  I do not see any new fractures.  I would still continue as planned with the pain medication and have him follow-up with the spine clinic.  They should be able to see the images that he had done here.  Please let me know how he is doing on the new hydrocodone  prescription.  Thanks.

## 2024-01-04 NOTE — Assessment & Plan Note (Signed)
 Recheck plain films today. I asked him to call about follow up at [facility] at [facility] tizanidine  and hydrocodone  with sedation caution.  He can take hydrocodone  1 pill 3 times daily if needed.  He can update me next week about his situation regarding pain control.

## 2024-01-05 NOTE — Telephone Encounter (Signed)
 Noted, thanks.  If he'll update me a few days prior to running out of pain medicine, I can work on his refill.  Thanks.

## 2024-01-05 NOTE — Telephone Encounter (Signed)
 Patient advises that on the new prescription he is doing fairly well. He has made an appointment with the spine clinic to follow up with them

## 2024-01-06 ENCOUNTER — Other Ambulatory Visit: Payer: Self-pay

## 2024-01-06 DIAGNOSIS — J4489 Other specified chronic obstructive pulmonary disease: Secondary | ICD-10-CM

## 2024-01-07 ENCOUNTER — Encounter: Payer: Self-pay | Admitting: Pulmonary Disease

## 2024-01-07 ENCOUNTER — Ambulatory Visit (INDEPENDENT_AMBULATORY_CARE_PROVIDER_SITE_OTHER): Admitting: Orthopedic Surgery

## 2024-01-07 ENCOUNTER — Ambulatory Visit (INDEPENDENT_AMBULATORY_CARE_PROVIDER_SITE_OTHER): Admitting: Pulmonary Disease

## 2024-01-07 ENCOUNTER — Encounter: Payer: Self-pay | Admitting: Orthopedic Surgery

## 2024-01-07 VITALS — BP 138/74 | Ht 64.0 in | Wt 165.0 lb

## 2024-01-07 DIAGNOSIS — S22060D Wedge compression fracture of T7-T8 vertebra, subsequent encounter for fracture with routine healing: Secondary | ICD-10-CM | POA: Diagnosis not present

## 2024-01-07 DIAGNOSIS — J449 Chronic obstructive pulmonary disease, unspecified: Secondary | ICD-10-CM

## 2024-01-07 DIAGNOSIS — J439 Emphysema, unspecified: Secondary | ICD-10-CM

## 2024-01-07 DIAGNOSIS — Z87891 Personal history of nicotine dependence: Secondary | ICD-10-CM | POA: Diagnosis not present

## 2024-01-07 DIAGNOSIS — R911 Solitary pulmonary nodule: Secondary | ICD-10-CM | POA: Diagnosis not present

## 2024-01-07 DIAGNOSIS — J4489 Other specified chronic obstructive pulmonary disease: Secondary | ICD-10-CM | POA: Diagnosis not present

## 2024-01-07 DIAGNOSIS — S22069D Unspecified fracture of T7-T8 vertebra, subsequent encounter for fracture with routine healing: Secondary | ICD-10-CM

## 2024-01-07 LAB — PULMONARY FUNCTION TEST
DL/VA % pred: 58 %
DL/VA: 2.34 ml/min/mmHg/L
DLCO unc % pred: 37 %
DLCO unc: 8.87 ml/min/mmHg
FEF 25-75 Post: 0.38 L/s
FEF 25-75 Pre: 0.34 L/s
FEF2575-%Change-Post: 13 %
FEF2575-%Pred-Post: 17 %
FEF2575-%Pred-Pre: 15 %
FEV1-%Change-Post: -1 %
FEV1-%Pred-Post: 30 %
FEV1-%Pred-Pre: 31 %
FEV1-Post: 0.89 L
FEV1-Pre: 0.91 L
FEV1FVC-%Change-Post: -8 %
FEV1FVC-%Pred-Pre: 57 %
FEV6-%Change-Post: 5 %
FEV6-%Pred-Post: 58 %
FEV6-%Pred-Pre: 54 %
FEV6-Post: 2.17 L
FEV6-Pre: 2.05 L
FEV6FVC-%Change-Post: -1 %
FEV6FVC-%Pred-Post: 99 %
FEV6FVC-%Pred-Pre: 100 %
FVC-%Change-Post: 7 %
FVC-%Pred-Post: 58 %
FVC-%Pred-Pre: 54 %
FVC-Post: 2.32 L
FVC-Pre: 2.17 L
Post FEV1/FVC ratio: 38 %
Post FEV6/FVC ratio: 94 %
Pre FEV1/FVC ratio: 42 %
Pre FEV6/FVC Ratio: 95 %
RV % pred: 276 %
RV: 6.63 L
TLC % pred: 121 %
TLC: 8.08 L

## 2024-01-07 NOTE — Progress Notes (Signed)
   Synopsis: Referred in  by Annitta Kindler, MD   Subjective:   PATIENT ID: Mort Ards GENDER: male DOB: 1951-06-06, MRN: [id number]  No chief complaint on file.   HPI Mr. Ascher is a 73 year old male patient with a past medical history of clinical COPD on Stiolto previous patient of Dr. Waymond Hailey presenting today to the pulmonary clinic to establish care.  He reports that his shortness of breath has been worsening over the past year.  Using albuterol  on a daily basis as needed couple of times.  Has been hospitalized only once for COPD exacerbation and has required systemic steroids only once for COPD exacerbation. He does report 10 to 15lbs unintentional weightloss in the past year.   Last chest x-ray in 2023 with increased reticular nodular opacities with superimposed emphysema.  Family history -no family history of lung cancer.  Social history -quit smoking in 2012.  Smoked 1 pack/day for 50 years.  ROS All systems were reviewed and are negative except for the above.  Objective:   There were no vitals filed for this visit.    on RA BMI Readings from Last 3 Encounters:  01/07/24 28.32 kg/m  01/02/24 28.46 kg/m  10/24/23 27.74 kg/m   Wt Readings from Last 3 Encounters:  01/07/24 165 lb (74.8 kg)  01/02/24 165 lb 12.8 oz (75.2 kg)  10/24/23 161 lb 9.6 oz (73.3 kg)    Physical Exam GEN: NAD, Healthy Appearing HEENT: Supple Neck, Reactive Pupils, EOMI  CVS: Normal S1, Normal S2, RRR, No murmurs or ES appreciated  Lungs: Biateral expiratory wheezing.  Abdomen: Soft, non tender, non distended, + BS  Extremities: Warm and well perfused, No edema  Skin: No suspicious lesions appreciated  Psych: Normal Affect  Ancillary Information   CBC    Component Value Date/Time   WBC 6.1 10/05/2023 1737   RBC 4.25 10/05/2023 1737   HGB 12.9 (L) 10/05/2023 1737   HCT 37.2 (L) 10/05/2023 1737   PLT 228 10/05/2023 1737   MCV 87.5 10/05/2023 1737   MCH 30.4 10/05/2023  1737   MCHC 34.7 10/05/2023 1737   RDW 12.9 10/05/2023 1737   LYMPHSABS 1.3 10/05/2023 1737   MONOABS 0.7 10/05/2023 1737   EOSABS 0.1 10/05/2023 1737   BASOSABS 0.1 10/05/2023 1737    Labs and imaging were reviewed.      No data to display           Assessment & Plan:  Mr. Cortright is a 73 year old male patient with a past medical history of clinical COPD on Stiolto previous patient of Dr. Waymond Hailey presenting today to the pulmonary clinic to establish care.  #Clinical COPD with emphysema  DOE worsening with years. Unintentional 10lbs weight loss.   []  PFTs  []  CT scan of the chest (unilateral wheezing with unintentional weightloss and presvious smoker)  []  start Budesonide-Formoterol-Glycopyrrolate [Breztri ] 2 puffs twice a day.  []  C/w Albuterol  as needed.  []  Will enroll in LDCT on next visit.   No follow-ups on file.  I spent 60 minutes caring for this patient today, including preparing to see the patient, obtaining a medical history , reviewing a separately obtained history, performing a medically appropriate examination and/or evaluation, counseling and educating the patient/family/caregiver, ordering medications, tests, or procedures, documenting clinical information in the electronic health record, and independently interpreting results (not separately reported/billed) and communicating results to the patient/family/caregiver  Annitta Kindler, MD [place] [facility] 01/07/2024 12:57 PM

## 2024-01-07 NOTE — Progress Notes (Signed)
 Synopsis: Referred in  by Roger Kindler, MD   Subjective:   PATIENT ID: Roger Lewis, Roger Lewis  Chief Complaint  Patient presents with   Follow-up    DOE. No wheezing or cough. PFT today.    HPI Roger Lewis is a 73 year old male patient with a past medical history of clinical COPD on Stiolto previous patient of Roger Lewis presenting today to the pulmonary clinic to establish care.  He reports that his shortness of breath has been worsening over the past year.  Using albuterol  on a daily basis as needed couple of times.  Has been hospitalized only once for COPD exacerbation and has required systemic steroids only once for COPD exacerbation. He does report 10 to 15lbs unintentional weightloss in the past year.   Last chest x-ray in 2023 with increased reticular nodular opacities with superimposed emphysema.  01/07/2024 - Patient feeling overall well. Doing well on Breztri . PFTs done today with Stage III COPD.    Family history -no family history of lung cancer.  Social history -quit smoking in 2012.  Smoked 1 pack/day for 50 years.  ROS All systems were reviewed and are negative except for the above.  Objective:   Vitals:   01/07/24 1323  BP: 120/80  Pulse: 80  Temp: (!) 97.1 F (36.2 C)  SpO2: 97%  Weight: 163 lb 3.2 oz (74 kg)  Height: 5\' 8"  (1.727 m)    97% on RA BMI Readings from Last 3 Encounters:  01/07/24 24.81 kg/m  01/07/24 24.81 kg/m  01/07/24 28.32 kg/m   Wt Readings from Last 3 Encounters:  01/07/24 163 lb 3.2 oz (74 kg)  01/07/24 163 lb 3.2 oz (74 kg)  01/07/24 165 lb (74.8 kg)    Physical Exam GEN: NAD, Healthy Appearing HEENT: Supple Neck, Reactive Pupils, EOMI  CVS: Normal S1, Normal S2, RRR, No murmurs or ES appreciated  Lungs: Biateral expiratory wheezing.  Abdomen: Soft, non tender, non distended, + BS  Extremities: Warm and well perfused, No edema  Skin: No suspicious lesions appreciated   Psych: Normal Affect  Ancillary Information   CBC    Component Value Date/Time   WBC 6.1 10/05/2023 1737   RBC 4.25 10/05/2023 1737   HGB 12.9 (L) 10/05/2023 1737   HCT 37.2 (L) 10/05/2023 1737   PLT 228 10/05/2023 1737   MCV 87.5 10/05/2023 1737   MCH 30.4 10/05/2023 1737   MCHC 34.7 10/05/2023 1737   RDW 12.9 10/05/2023 1737   LYMPHSABS 1.3 10/05/2023 1737   MONOABS 0.7 10/05/2023 1737   EOSABS 0.1 10/05/2023 1737   BASOSABS 0.1 10/05/2023 1737    Labs and imaging were reviewed.     Latest Ref Rng & Units 01/07/2024   12:24 PM  PFT Results  FVC-Pre L 2.17  P  FVC-Predicted Pre % 54  P  FVC-Post L 2.32  P  FVC-Predicted Post % 58  P  Pre FEV1/FVC % % 42  P  Post FEV1/FCV % % 38  P  FEV1-Pre L 0.91  P  FEV1-Predicted Pre % 31  P  FEV1-Post L 0.89  P  DLCO uncorrected ml/min/mmHg 8.87  P  DLCO UNC% % 37  P  DLVA Predicted % 58  P  TLC L 8.08  P  TLC % Predicted % 121  P  RV % Predicted % 276  P    P Preliminary result     Assessment & Plan:  Roger Lewis is  a 73 year old male patient with a past medical history of clinical COPD on Stiolto previous patient of Roger Lewis presenting today to the pulmonary clinic to establish care.  #COPD Stage IV with emphysema  DOE worsening with years. Unintentional 10lbs weight loss.  c/wBudesonide-Formoterol-Glycopyrrolate [Breztri ] 2 puffs twice a day. Provided Aerochamber. Advised mouth rinsing after each use.  []  C/w Albuterol  as needed.   #RUL lung nodule measuring 6 mm.   []  Obtain CT chest in July 2025.   Return in about 6 months (around 07/08/2024).  I spent 30 minutes caring for this patient today, including preparing to see the patient, obtaining a medical history , reviewing a separately obtained history, performing a medically appropriate examination and/or evaluation, counseling and educating the patient/family/caregiver, ordering medications, tests, or procedures, documenting clinical information in the  electronic health record, and independently interpreting results (not separately reported/billed) and communicating results to the patient/family/caregiver  Roger Kindler, MD [place] [facility] 01/07/2024 4:02 PM

## 2024-01-07 NOTE — Progress Notes (Signed)
 Referring Physician:  Donnie Galea, MD [address] [place],  [place] [address]  Primary Physician:  Donnie Galea, MD  History of Present Illness: 01/07/2024 [name] has a history of COPD, GERD.   Last seen by me on 10/23/23 for T8 compression fracture that likely occurred on 10/03/23 when he was lifting hay bales. He has known chronic vertebral height loss of T7 and T9.   He was to get TLSO brace at last visit. He is here for follow up.   He continues with more constant mid thoracic pain that is worse on right side. No radiation of pain around to ribs. No arm or leg pain. No numbness, tingling, or weakness.   He is still having to take norco regularly. He ran out of it last week and pain was a 10/10. He is wearing his TLSO brace. He is trying to avoid bending, twisting, lifting.   He quit smoking in 2010.   Bowel/Bladder Dysfunction: none  The symptoms are causing a significant impact on the patient's life.   Review of Systems:  A 10 point review of systems is negative, except for the pertinent positives and negatives detailed in the HPI.  Past Medical History: Past Medical History:  Diagnosis Date   COPD (chronic obstructive pulmonary disease) (HCC)    GERD (gastroesophageal reflux disease)    Renal stones     Past Surgical History: Past Surgical History:  Procedure Laterality Date   HAND SURGERY Right     Allergies: Allergies as of 01/07/2024 - Review Complete 01/07/2024  Allergen Reaction Noted   Penicillins Rash 09/05/2015   Flexeril  [cyclobenzaprine ] Other (See Comments) 11/17/2017    Medications: Outpatient Encounter Medications as of 01/07/2024  Medication Sig   acetaminophen  (TYLENOL ) 325 MG tablet Take 325 mg by mouth 3 (three) times daily as needed.   albuterol  (VENTOLIN  HFA) 108 (90 Base) MCG/ACT inhaler USE 1 TO 2 INHALATIONS BY MOUTH  EVERY 6 HOURS AS NEEDED FOR  SHORTNESS OF BREATH OR WHEEZING    Budeson-Glycopyrrol-Formoterol (BREZTRI  AEROSPHERE) 160-9-4.8 MCG/ACT AERO Inhale 2 puffs into the lungs in the morning and at bedtime.   fluticasone  (FLONASE ) 50 MCG/ACT nasal spray Place 2 sprays into both nostrils daily as needed for allergies or rhinitis.   HYDROcodone -acetaminophen  (NORCO/VICODIN) 5-325 MG tablet Take 1 tablet by mouth 3 (three) times daily as needed (for pain.  sedation caution).   ibuprofen  (ADVIL ) 200 MG tablet Take 200 mg by mouth 3 (three) times daily as needed (with food.).   Multiple Vitamin (MULTIVITAMIN) tablet Take 1 tablet by mouth daily.   tamsulosin  (FLOMAX ) 0.4 MG CAPS capsule Take 1 capsule (0.4 mg total) by mouth daily as needed.   tiZANidine  (ZANAFLEX ) 4 MG tablet Take 1/2 tablet at bedtime prn muscle spasm   triamcinolone  cream (KENALOG ) 0.5 % Apply 1 Application topically 2 (two) times daily as needed. Only use as needed on affected area   No facility-administered encounter medications on file as of 01/07/2024.    Social History: Social History   Tobacco Use   Smoking status: Former    Current packs/day: 0.00    Average packs/day: 1 pack/day for 40.0 years (40.0 ttl pk-yrs)    Types: Cigarettes    Start date: 09/09/1968    Quit date: 09/09/2008    Years since quitting: 15.3   Smokeless tobacco: Never  Substance Use Topics   Alcohol use: No    Alcohol/week: 0.0 standard drinks of alcohol  Comment: Very rare   Drug use: Never    Family Medical History: Family History  Problem Relation Age of Onset   COPD Mother    COPD Father    Prostate cancer Paternal Uncle    Coronary artery disease Brother 11       multiple stents   Colon cancer Neg Hx     Physical Examination: Vitals:   01/07/24 0932  BP: 138/74      Awake, alert, oriented to person, place, and time.  Speech is clear and fluent. Fund of knowledge is appropriate.   Cranial Nerves: Pupils equal round and reactive to light.  Facial tone is symmetric.    He has midline  tenderness around T7-T8  No abnormal lesions on exposed skin.   Strength: Side Biceps Triceps Deltoid Interossei Grip Wrist Ext. Wrist Flex.  R 5 5 5 5 5 5 5   L 5 5 5 5 5 5 5    Side Iliopsoas Quads Hamstring PF DF EHL  R 5 5 5 5 5 5   L 5 5 5 5 5 5    Reflexes are 2+ and symmetric at the biceps, brachioradialis, patella and achilles.   Hoffman's is absent.  Clonus is not present.   Bilateral upper and lower extremity sensation is intact to light touch.     Gait is normal.    Medical Decision Making  Imaging: Thoracic xrays dated 01/02/24:  Compression fracture T7 and T8 that appear to have progressed since CT on 10/17/23.   Report for above xrays is not yet available.    Assessment and Plan: Mr. Rancourt has mid back pain that started on 10/03/23 when he was lifting hay bales. Was found to have T8 compression fracture along with chronic vertebral height loss of T7 and T9.   He has constant mid back pain that is worse on right. No radiation to his ribs. No arm or leg pain. No numbness, tingling, or weakness. Still having to take norco frequently for pain.   Xrays show compression fracture T7 and T8 that appear to have progressed since CT on 10/17/23.   Treatment options discussed with patient and following plan made:   - He is still having significant pain. Recommend MRI of thoracic spine and referral to IR to consider kyphoplasty. He declines.  - He will continue with TLSO when up and walking. No bending, twisting, or lifting.  - Follow up with me in 4 weeks with repeat xrays. If they are stable, will likely d/c TLSO brace.  - Continue prn hydrocodone  from his PCP.   I spent a total of 25 minutes in face-to-face and non-face-to-face activities related to this patient's care today including review of outside records, review of imaging, review of symptoms, physical exam, discussion of differential diagnosis, discussion of treatment options, and documentation.   Lucetta Russel PA-C Dept.  of Neurosurgery

## 2024-01-07 NOTE — Patient Instructions (Signed)
 It was so nice to see you today. Thank you so much for coming in.    You have a broken bone at T8 and likely at T7 as well.    Wear brace when you are up and walking. Do not wear to bed. Can remove when sitting if more comfortable.   No bending, twisting, or lifting.   If pain gets worse, we can consider a procedure called a kyphoplasty (injection of cement into the bone). You would need a thoracic MRI prior to having this done. Call me if you want to proceed with this.   I will see you back in 4 weeks. Get xrays prior to your visit with me.   Please do not hesitate to call if you have any questions or concerns. You can also message me in MyChart.    Lucetta Russel PA-C [phone]     The physicians and staff at [facility] at [facility] are committed to providing excellent care. You may receive a survey asking for feedback about your experience at our office. We value you your feedback and appreciate you taking the time to to fill it out. The [facility] leadership team is also available to discuss your experience in person, feel free to contact us  [phone].

## 2024-01-07 NOTE — Patient Instructions (Signed)
 Full PFT completed today ? ?

## 2024-01-07 NOTE — Progress Notes (Signed)
 Full PFT completed today ? ?

## 2024-01-12 ENCOUNTER — Telehealth: Payer: Self-pay | Admitting: Family Medicine

## 2024-01-12 ENCOUNTER — Other Ambulatory Visit (INDEPENDENT_AMBULATORY_CARE_PROVIDER_SITE_OTHER): Admitting: Pharmacist

## 2024-01-12 DIAGNOSIS — J449 Chronic obstructive pulmonary disease, unspecified: Secondary | ICD-10-CM

## 2024-01-12 NOTE — Telephone Encounter (Signed)
 Patient came in office signed ppwk for AZ&Me placed in providers box for signature.

## 2024-01-12 NOTE — Progress Notes (Signed)
 Brief Telephone Documentation Reason for Call: Patient left message regarding question for pharmacist - Breztri  inhaler is costly  Summary of Call: $47/month foBreztri  which is not affordable for him.   Called patient 01/12/24. No answer, left voicemail with direct callback number.  Lives in household of 1 with income primary from SSI, ~$25,000/year  Application initiated for AZ&Me PAP   Follow Up: Patient given direct line for further questions/concerns.  Daron Ellen, PharmD Clinical Pharmacist [facility] [phone]

## 2024-01-12 NOTE — Progress Notes (Signed)
 Patient Assistance Program (PAP) Application   Manufacturer: [employer] (AZ&Me)    (New enrollment) Medication(s): Breztri   Patient Portion of Application:  01/12/24: Filled out and uploaded to clinic eFax folder for patient signature in front office.   Income Documentation: N/A - Electronic verification elected.  Provider Portion of Application:  01/12/24:  Completed by pharmD. Kept with patient pages. To be signed after patient.  Prescription(s): Included in MAP application.    Next Steps: [x]    Application filled out and uploaded to [employer] for review/signature at front office []    Once patient signs, front office to place application in PCP folder []    Upon PCP signature Application to be faxed to AZ&Me Fax: [phone] AND scanned into patient chart   Forwarded to [facility] C[facility] Patient Advocate Team for future correspondences/re-enrollment.  Note routed to PCP Clinic Pool to ensure PCP signature is obtained and application is faxed.  *[facility] clinic team - Please Addend/update this note as the "Next Steps" are completed in office*

## 2024-01-13 NOTE — Telephone Encounter (Signed)
 Placed in your tray

## 2024-01-14 NOTE — Telephone Encounter (Signed)
 I will work on [employer].  Thanks.

## 2024-01-15 ENCOUNTER — Encounter: Payer: Self-pay | Admitting: Family Medicine

## 2024-01-15 DIAGNOSIS — S22060D Wedge compression fracture of T7-T8 vertebra, subsequent encounter for fracture with routine healing: Secondary | ICD-10-CM

## 2024-01-21 ENCOUNTER — Other Ambulatory Visit: Payer: Self-pay | Admitting: Family Medicine

## 2024-01-21 DIAGNOSIS — S22060D Wedge compression fracture of T7-T8 vertebra, subsequent encounter for fracture with routine healing: Secondary | ICD-10-CM

## 2024-01-21 MED ORDER — HYDROCODONE-ACETAMINOPHEN 5-325 MG PO TABS: 1.0000 | ORAL_TABLET | Freq: Three times a day (TID) | ORAL | 0 refills | Status: DC | PRN

## 2024-01-27 ENCOUNTER — Ambulatory Visit: Payer: Self-pay | Admitting: Pulmonary Disease

## 2024-02-02 NOTE — Progress Notes (Unsigned)
 Referring Physician:  Donnie Galea, MD [address] [place],  [place] [address]  Primary Physician:  Donnie Galea, MD  History of Present Illness: 02/05/2024 [name] has a history of COPD, GERD.   Last seen by me on 01/07/24 for T8 compression fracture that likely occurred on 10/03/23 when he was lifting hay bales. He has known chronic vertebral height loss of T7 and T9.   He was having more pain at his last visit. Imaging showed some progression of his T7 and T8 fractures. MRI recommended to consider kyphoplasty and he declined.   He is here for follow up.   He is doing better since his last visit with improvement in pain. He has intermittent mid thoracic pain that is more on right side. He has been weaning out of his TLSO brace. No arm or leg pain. No numbness, tingling, or weakness.   He has been weaning off his norco. Did not take any in last 2 days.   He quit smoking in 2010.   Bowel/Bladder Dysfunction: none  The symptoms are causing a significant impact on the patient's life.   Review of Systems:  A 10 point review of systems is negative, except for the pertinent positives and negatives detailed in the HPI.  Past Medical History: Past Medical History:  Diagnosis Date   COPD (chronic obstructive pulmonary disease) (HCC)    GERD (gastroesophageal reflux disease)    Renal stones     Past Surgical History: Past Surgical History:  Procedure Laterality Date   HAND SURGERY Right     Allergies: Allergies as of 02/05/2024 - Review Complete 02/05/2024  Allergen Reaction Noted   Penicillins Rash 09/05/2015   Flexeril  [cyclobenzaprine ] Other (See Comments) 11/17/2017    Medications: Outpatient Encounter Medications as of 02/05/2024  Medication Sig   acetaminophen  (TYLENOL ) 325 MG tablet Take 325 mg by mouth 3 (three) times daily as needed.   albuterol  (VENTOLIN  HFA) 108 (90 Base) MCG/ACT inhaler USE 1 TO 2 INHALATIONS BY MOUTH  EVERY 6 HOURS  AS NEEDED FOR  SHORTNESS OF BREATH OR WHEEZING   Budeson-Glycopyrrol-Formoterol (BREZTRI  AEROSPHERE) 160-9-4.8 MCG/ACT AERO Inhale 2 puffs into the lungs in the morning and at bedtime.   fluticasone  (FLONASE ) 50 MCG/ACT nasal spray Place 2 sprays into both nostrils daily as needed for allergies or rhinitis.   HYDROcodone -acetaminophen  (NORCO/VICODIN) 5-325 MG tablet Take 1 tablet by mouth 3 (three) times daily as needed (for pain.  sedation caution).   ibuprofen  (ADVIL ) 200 MG tablet Take 200 mg by mouth 3 (three) times daily as needed (with food.).   Multiple Vitamin (MULTIVITAMIN) tablet Take 1 tablet by mouth daily.   tamsulosin  (FLOMAX ) 0.4 MG CAPS capsule Take 1 capsule (0.4 mg total) by mouth daily as needed.   tiZANidine  (ZANAFLEX ) 4 MG tablet Take 1/2 tablet at bedtime prn muscle spasm   triamcinolone  cream (KENALOG ) 0.5 % Apply 1 Application topically 2 (two) times daily as needed. Only use as needed on affected area   No facility-administered encounter medications on file as of 02/05/2024.    Social History: Social History   Tobacco Use   Smoking status: Former    Current packs/day: 0.00    Average packs/day: 1 pack/day for 40.0 years (40.0 ttl pk-yrs)    Types: Cigarettes    Start date: 09/09/1968    Quit date: 09/09/2008    Years since quitting: 15.4   Smokeless tobacco: Never  Substance Use Topics   Alcohol use:  No    Alcohol/week: 0.0 standard drinks of alcohol    Comment: Very rare   Drug use: Never    Family Medical History: Family History  Problem Relation Age of Onset   COPD Mother    COPD Father    Prostate cancer Paternal Uncle    Coronary artery disease Brother 1       multiple stents   Colon cancer Neg Hx     Physical Examination: Vitals:   02/05/24 1058  BP: 138/78       Awake, alert, oriented to person, place, and time.  Speech is clear and fluent. Fund of knowledge is appropriate.   Cranial Nerves: Pupils equal round and reactive to light.   Facial tone is symmetric.    He has midline tenderness around T7-T8  No abnormal lesions on exposed skin.   Strength: Side Biceps Triceps Deltoid Interossei Grip Wrist Ext. Wrist Flex.  R 5 5 5 5 5 5 5   L 5 5 5 5 5 5 5    Side Iliopsoas Quads Hamstring PF DF EHL  R 5 5 5 5 5 5   L 5 5 5 5 5 5    Reflexes are 2+ and symmetric at the biceps, brachioradialis, patella and achilles.   Hoffman's is absent.  Clonus is not present.   Bilateral upper and lower extremity sensation is intact to light touch.     Gait is normal.    Medical Decision Making  Imaging: Thoracic xrays dated 02/05/24:  Compression fracture T7 and T8 that appear stable compared to previous xrays  Report for above xrays is not yet available.    Assessment and Plan: Roger Lewis is doing better since his last visit with improvement in pain. He has intermittent mid thoracic pain that is more on right side. No arm or leg pain. No numbness, tingling, or weakness.   Xrays show compression fracture T7 and T8 that appear stable in comparison to previous xrays.   Treatment options discussed with patient and following plan made:   - He can wean out of TLSO.  - He should still be careful with bending, twisting, or lifting.  - He asks about getting back to water skiing. Recommend he get DEXA done first (was ordered but not done). If he has osteoporosis, he would be at increased risk for fractures.  - He will follow up with me prn.   I spent a total of 20 minutes in face-to-face and non-face-to-face activities related to this patient's care today including review of outside records, review of imaging, review of symptoms, physical exam, discussion of differential diagnosis, discussion of treatment options, and documentation.   Lucetta Russel PA-C Dept. of Neurosurgery

## 2024-02-05 ENCOUNTER — Ambulatory Visit
Admission: RE | Admit: 2024-02-05 | Discharge: 2024-02-05 | Disposition: A | Discharge status: Home / Self Care | Source: Ambulatory Visit | Attending: Orthopedic Surgery | Admitting: Orthopedic Surgery

## 2024-02-05 ENCOUNTER — Ambulatory Visit (INDEPENDENT_AMBULATORY_CARE_PROVIDER_SITE_OTHER): Admitting: Orthopedic Surgery

## 2024-02-05 ENCOUNTER — Ambulatory Visit
Admission: RE | Admit: 2024-02-05 | Discharge: 2024-02-05 | Disposition: A | Discharge status: Home / Self Care | Attending: Orthopedic Surgery | Admitting: Orthopedic Surgery

## 2024-02-05 ENCOUNTER — Encounter: Payer: Self-pay | Admitting: Orthopedic Surgery

## 2024-02-05 VITALS — BP 138/78 | Ht 68.0 in | Wt 163.0 lb

## 2024-02-05 DIAGNOSIS — S22069D Unspecified fracture of T7-T8 vertebra, subsequent encounter for fracture with routine healing: Secondary | ICD-10-CM | POA: Insufficient documentation

## 2024-02-05 DIAGNOSIS — S22060D Wedge compression fracture of T7-T8 vertebra, subsequent encounter for fracture with routine healing: Secondary | ICD-10-CM | POA: Diagnosis not present

## 2024-02-05 DIAGNOSIS — M4854XA Collapsed vertebra, not elsewhere classified, thoracic region, initial encounter for fracture: Secondary | ICD-10-CM | POA: Diagnosis not present

## 2024-02-26 ENCOUNTER — Other Ambulatory Visit: Payer: Self-pay | Admitting: Family Medicine

## 2024-02-26 DIAGNOSIS — S22060D Wedge compression fracture of T7-T8 vertebra, subsequent encounter for fracture with routine healing: Secondary | ICD-10-CM

## 2024-02-26 NOTE — Telephone Encounter (Signed)
Sent. Please get update on patient re: pain.  Thanks.

## 2024-02-26 NOTE — Telephone Encounter (Signed)
 LOV: 01/02/24 ZOX:WRUEAVW SCHEDULED   LAST REFILL: HYDROCODONE -APAP 5-325 MG T 5-325 Tablet 01/21/24 30 TABLETS 0 REFILLS

## 2024-02-27 ENCOUNTER — Telehealth: Payer: Self-pay

## 2024-02-27 NOTE — Telephone Encounter (Signed)
 Gave pt a call to follow up on PAP AZ&ME (Breztri ) spoke with pt said he has not hear back from anyone, explain I was going to check with [facility] Heidi Llamas, pt came in in May to providers office to sign application and was going to follow up with him.

## 2024-03-01 NOTE — Telephone Encounter (Signed)
 Noted. Thanks.

## 2024-03-01 NOTE — Telephone Encounter (Signed)
 Patient notified.  Patient states he is doing good, gets the pain when he is very active.  States he is going on vacation soon and wanted some on hand to take in case.

## 2024-03-22 ENCOUNTER — Telehealth: Payer: Self-pay

## 2024-03-22 ENCOUNTER — Other Ambulatory Visit: Payer: Self-pay | Admitting: Family Medicine

## 2024-03-22 DIAGNOSIS — S22060D Wedge compression fracture of T7-T8 vertebra, subsequent encounter for fracture with routine healing: Secondary | ICD-10-CM

## 2024-03-22 NOTE — Telephone Encounter (Signed)
 Copied from CRM [id number]. Topic: Clinical - Prescription Issue >> Mar 22, 2024 12:22 PM Roger Lewis wrote: Reason for CRM: Patient is needing assistance with his Breztri  - states he has 10 doses left but nothing comes out. Patient is requesting an alternative, states the medicine is good but the delivery system just does not work.

## 2024-03-22 NOTE — Telephone Encounter (Signed)
 Patient reports that inhaler is not delivering medication. He reports he did complete application for patient assistance for Breztri . He has not heard from them. I advised to call [employer]. I advised to take inhaler to pharmacy to have pharmacist check the inhaler out.

## 2024-03-24 NOTE — Telephone Encounter (Signed)
 Please get update on patient about pain level and med use.  Prescription sent.  Thanks.

## 2024-03-31 ENCOUNTER — Other Ambulatory Visit: Payer: Self-pay | Admitting: Pharmacist

## 2024-03-31 NOTE — Progress Notes (Signed)
 Patient Assistance Program (PAP) Application   Previously filled out, unclear if submitted. Re-print is reasonable. [facility] Access team spoke w patient today informing him application can be re-printed and signed.   Called and left patient a voicemail 03/31/24 informing him that the application can be signed at any time he is available to drop by clinic.   Application is in front office eFax folder.   Once signed by patient, please place in PCP folder for signature THEN Fax to [facility] CPhT Medication Advocate Team and place in scan tray for upload to chart.   PAP Team: CPhT Patient Advocate Team Fax: [phone]

## 2024-04-02 ENCOUNTER — Ambulatory Visit: Admission: RE | Admit: 2024-04-02 | Source: Ambulatory Visit

## 2024-04-02 ENCOUNTER — Telehealth: Payer: Self-pay | Admitting: Family Medicine

## 2024-04-02 NOTE — Telephone Encounter (Signed)
 Patient arrived in office to sign AZ&ME ppwk. Placed in providers box for signature at front desk

## 2024-04-05 ENCOUNTER — Ambulatory Visit
Admission: RE | Admit: 2024-04-05 | Discharge: 2024-04-05 | Disposition: A | Discharge status: Home / Self Care | Source: Ambulatory Visit | Attending: Pulmonary Disease | Admitting: Pulmonary Disease

## 2024-04-05 DIAGNOSIS — R911 Solitary pulmonary nodule: Secondary | ICD-10-CM | POA: Diagnosis not present

## 2024-04-05 DIAGNOSIS — R0789 Other chest pain: Secondary | ICD-10-CM | POA: Diagnosis not present

## 2024-04-05 DIAGNOSIS — R918 Other nonspecific abnormal finding of lung field: Secondary | ICD-10-CM | POA: Diagnosis not present

## 2024-04-05 DIAGNOSIS — J439 Emphysema, unspecified: Secondary | ICD-10-CM | POA: Diagnosis not present

## 2024-04-05 DIAGNOSIS — R0609 Other forms of dyspnea: Secondary | ICD-10-CM | POA: Diagnosis not present

## 2024-04-05 NOTE — Telephone Encounter (Signed)
 I'll work on the hard copy.  Thanks.

## 2024-04-05 NOTE — Telephone Encounter (Signed)
 Placed in your tray

## 2024-04-07 ENCOUNTER — Other Ambulatory Visit: Payer: Self-pay | Admitting: Family Medicine

## 2024-04-07 DIAGNOSIS — S22060D Wedge compression fracture of T7-T8 vertebra, subsequent encounter for fracture with routine healing: Secondary | ICD-10-CM

## 2024-04-07 MED ORDER — HYDROCODONE-ACETAMINOPHEN 5-325 MG PO TABS: 1.0000 | ORAL_TABLET | Freq: Three times a day (TID) | ORAL | 0 refills | Status: DC | PRN

## 2024-04-09 ENCOUNTER — Other Ambulatory Visit: Payer: Self-pay | Admitting: Family Medicine

## 2024-04-22 ENCOUNTER — Other Ambulatory Visit: Payer: Self-pay | Admitting: Family Medicine

## 2024-04-22 DIAGNOSIS — S22060D Wedge compression fracture of T7-T8 vertebra, subsequent encounter for fracture with routine healing: Secondary | ICD-10-CM

## 2024-04-22 NOTE — Telephone Encounter (Signed)
 LOV: 01/02/2024 NOV: nothing scheduled  Last Refill: HYDROcodone -acetaminophen  (NORCO/VICODIN) 5-325 MG tablet 30 tablets 0 refills

## 2024-04-26 MED ORDER — HYDROCODONE-ACETAMINOPHEN 5-325 MG PO TABS: 1.0000 | ORAL_TABLET | Freq: Three times a day (TID) | ORAL | 0 refills | Status: DC | PRN

## 2024-04-26 NOTE — Telephone Encounter (Signed)
 Rx sent. Please shred any printed rx.  Thanks.

## 2024-05-01 ENCOUNTER — Other Ambulatory Visit (HOSPITAL_BASED_OUTPATIENT_CLINIC_OR_DEPARTMENT_OTHER): Payer: Self-pay

## 2024-05-01 ENCOUNTER — Emergency Department (HOSPITAL_BASED_OUTPATIENT_CLINIC_OR_DEPARTMENT_OTHER)
Admission: EM | Admit: 2024-05-01 | Discharge: 2024-05-01 | Disposition: A | Discharge status: Home / Self Care | Attending: Emergency Medicine | Admitting: Emergency Medicine

## 2024-05-01 ENCOUNTER — Emergency Department (HOSPITAL_BASED_OUTPATIENT_CLINIC_OR_DEPARTMENT_OTHER): Admitting: Radiology

## 2024-05-01 ENCOUNTER — Encounter (HOSPITAL_BASED_OUTPATIENT_CLINIC_OR_DEPARTMENT_OTHER): Payer: Self-pay

## 2024-05-01 ENCOUNTER — Other Ambulatory Visit: Payer: Self-pay

## 2024-05-01 DIAGNOSIS — R059 Cough, unspecified: Secondary | ICD-10-CM | POA: Diagnosis not present

## 2024-05-01 DIAGNOSIS — J449 Chronic obstructive pulmonary disease, unspecified: Secondary | ICD-10-CM | POA: Insufficient documentation

## 2024-05-01 DIAGNOSIS — Z87442 Personal history of urinary calculi: Secondary | ICD-10-CM | POA: Insufficient documentation

## 2024-05-01 DIAGNOSIS — Z7952 Long term (current) use of systemic steroids: Secondary | ICD-10-CM | POA: Insufficient documentation

## 2024-05-01 DIAGNOSIS — U071 COVID-19: Secondary | ICD-10-CM | POA: Insufficient documentation

## 2024-05-01 DIAGNOSIS — Z7951 Long term (current) use of inhaled steroids: Secondary | ICD-10-CM | POA: Diagnosis not present

## 2024-05-01 DIAGNOSIS — R0981 Nasal congestion: Secondary | ICD-10-CM | POA: Diagnosis present

## 2024-05-01 LAB — RESP PANEL BY RT-PCR (RSV, FLU A&B, COVID)  RVPGX2
Influenza A by PCR: NEGATIVE
Influenza B by PCR: NEGATIVE
Resp Syncytial Virus by PCR: NEGATIVE
SARS Coronavirus 2 by RT PCR: POSITIVE — AB

## 2024-05-01 MED ORDER — PREDNISONE 50 MG PO TABS
50.0000 mg | ORAL_TABLET | Freq: Every day | ORAL | 0 refills | Status: DC
  Filled 2024-05-01: qty 5, 5d supply, fill #0

## 2024-05-01 MED ORDER — PREDNISONE 50 MG PO TABS
60.0000 mg | ORAL_TABLET | Freq: Once | ORAL | Status: AC
  Administered 2024-05-01: 60 mg via ORAL
  Filled 2024-05-01: qty 1

## 2024-05-01 MED ORDER — OXYMETAZOLINE HCL 0.05 % NA SOLN
1.0000 | Freq: Once | NASAL | Status: AC
  Administered 2024-05-01: 1 via NASAL
  Filled 2024-05-01: qty 30

## 2024-05-01 MED ORDER — PAXLOVID (300/100) 20 X 150 MG & 10 X 100MG PO TBPK
3.0000 | ORAL_TABLET | Freq: Two times a day (BID) | ORAL | 0 refills | Status: AC
  Filled 2024-05-01: qty 30, 5d supply, fill #0

## 2024-05-01 NOTE — ED Triage Notes (Addendum)
 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 States he was unable to wear a mask, hx of COPD.

## 2024-05-01 NOTE — ED Provider Notes (Signed)
 [place] EMERGENCY DEPARTMENT AT [facility] Provider Note   CSN: [id number] Arrival date & time: 05/01/24  1321     Patient presents with: Nasal Congestion   Roger Lewis is a 73 y.o. male.   Pt is a 73 yo male with pmhx significant for COPD, GERD, and kidney stones.  Pt said he has a lot of nasal congestion and feels terrible.  He said he had a hard time getting out of bed today.  Pt denies fevers.  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        Prior to Admission medications   Medication Sig Start Date End Date Taking? Authorizing Provider  nirmatrelvir /ritonavir  (PAXLOVID , 300/100,) 20 x 150 MG & 10 x 100MG  TBPK Take 3 tablets by mouth 2 (two) times daily for 5 days.  (Take nirmatrelvir  (150 mg) two tablets twice daily for 5 days and ritonavir  (100 mg) one tablet twice daily for 5 days.) 05/01/24 05/06/24 Yes Dean Clarity, MD  predniSONE  (DELTASONE ) 50 MG tablet Take 1 tablet (50 mg total) by mouth daily with breakfast. 05/01/24  Yes Dean Clarity, MD  acetaminophen  (TYLENOL ) 325 MG tablet Take 325 mg by mouth 3 (three) times daily as needed.    [provider]  albuterol  (VENTOLIN  HFA) 108 (90 Base) MCG/ACT inhaler USE 1 TO 2 INHALATIONS BY MOUTH  EVERY 6 HOURS AS NEEDED FOR  SHORTNESS OF BREATH OR WHEEZING 04/09/24   Cleatus Arlyss RAMAN, MD  Budeson-Glycopyrrol-Formoterol (BREZTRI  AEROSPHERE) 160-9-4.8 MCG/ACT AERO Inhale 2 puffs into the lungs in the morning and at bedtime. 07/14/23   Assaker, Darrin, MD  fluticasone  (FLONASE ) 50 MCG/ACT nasal spray Place 2 sprays into both nostrils daily as needed for allergies or rhinitis. 10/09/22   Cleatus Arlyss RAMAN, MD  HYDROcodone -acetaminophen  (NORCO/VICODIN) 5-325 MG tablet Take 1 tablet by mouth 3 (three) times daily as needed for moderate pain (pain score 4-6). 04/26/24   Cleatus Arlyss RAMAN, MD  ibuprofen  (ADVIL ) 200 MG tablet Take 200 mg by mouth 3 (three) times daily as needed (with food.).    [provider]  Multiple Vitamin (MULTIVITAMIN) tablet Take 1 tablet by mouth daily.    [provider]  tamsulosin  (FLOMAX ) 0.4 MG CAPS capsule Take 1 capsule (0.4 mg total) by mouth daily as needed. 06/27/23   Cleatus Arlyss RAMAN, MD  tiZANidine  (ZANAFLEX ) 4 MG tablet Take 1/2 tablet at bedtime prn muscle spasm 01/02/24   Cleatus Arlyss RAMAN, MD  triamcinolone  cream (KENALOG ) 0.5 % Apply 1 Application topically 2 (two) times daily as needed. Only use as needed on affected area 06/24/22   Cleatus Arlyss RAMAN, MD    Allergies: Penicillins and Flexeril  [cyclobenzaprine ]    Review of Systems  Constitutional:  Positive for fatigue.  HENT:  Positive for congestion.   Respiratory:  Positive for cough.   All other systems reviewed and are negative.   Updated Vital Signs BP 132/83   Pulse 96   Temp 98.9 F (37.2 C) (Oral)   Resp (!) 21   SpO2 92%   Physical Exam Vitals and nursing note reviewed.  Constitutional:      Appearance: Normal appearance.  HENT:     Head: Normocephalic and atraumatic.     Right Ear: External ear normal.     Left Ear: External ear normal.     Nose: Congestion present.     Mouth/Throat:     Mouth: Mucous membranes are moist.     Pharynx: Oropharynx is  clear.  Eyes:     Extraocular Movements: Extraocular movements intact.     Pupils: Pupils are equal, round, and reactive to light.  Cardiovascular:     Rate and Rhythm: Normal rate and regular rhythm.     Pulses: Normal pulses.     Heart sounds: Normal heart sounds.  Pulmonary:     Effort: Pulmonary effort is normal.     Breath sounds: Normal breath sounds.  Abdominal:     General: Abdomen is flat. Bowel sounds are normal.     Palpations: Abdomen is soft.  Musculoskeletal:        General: Normal range of motion.     Cervical back: Normal range of motion and neck supple.  Skin:    General: Skin is warm.     Capillary Refill: Capillary refill takes less than 2 seconds.  Neurological:     General: No  focal deficit present.     Mental Status: He is alert and oriented to person, place, and time.  Psychiatric:        Mood and Affect: Mood normal.        Behavior: Behavior normal.     (all labs ordered are listed, but only abnormal results are displayed) Labs Reviewed  RESP PANEL BY RT-PCR (RSV, FLU A&B, COVID)  RVPGX2 - Abnormal; Notable for the following components:      Result Value   SARS Coronavirus 2 by RT PCR POSITIVE (*)    All other components within normal limits    EKG: None  Radiology: DG Chest 2 View Result Date: 05/01/2024 CLINICAL DATA:  Cough. EXAM: CHEST - 2 VIEW COMPARISON:  Radiograph 10/15/2023, CT 04/05/2024 FINDINGS: Advanced emphysema with chronic hyperinflation. Peribronchial thickening which is similar to prior. No confluent consolidation. The heart is normal in size with unchanged mediastinal contours. No pulmonary edema, pleural effusion, or pneumothorax. Chronic midthoracic compression deformities. IMPRESSION: 1. Advanced emphysema with chronic hyperinflation. Peribronchial thickening which is similar to prior. 2. No focal airspace disease. Electronically Signed   By: Andrea Gasman M.D.   On: 05/01/2024 14:54     Procedures   Medications Ordered in the ED  oxymetazoline  (AFRIN) 0.05 % nasal spray 1 spray (1 spray Each Nare Given 05/01/24 1415)  predniSONE  (DELTASONE ) tablet 60 mg (60 mg Oral Given 05/01/24 1513)                                    Medical Decision Making Amount and/or Complexity of Data Reviewed Radiology: ordered.  Risk OTC drugs. Prescription drug management.   This patient presents to the ED for concern of sob, this involves an extensive number of treatment options, and is a complaint that carries with it a high risk of complications and morbidity.  The differential diagnosis includes covid/flu/rsv, pna, COPD exac   Co morbidities that complicate the patient evaluation  COPD, GERD, and kidney stones   Additional  history obtained:  Additional history obtained from epic chart review  Lab Tests:  I Ordered, and personally interpreted labs.  The pertinent results include:  covid+; rsv/flu neg   Imaging Studies ordered:  I ordered imaging studies including cxr  I independently visualized and interpreted imaging which showed  Advanced emphysema with chronic hyperinflation. Peribronchial  thickening which is similar to prior.  2. No focal airspace disease.   I agree with the radiologist interpretation   Medicines ordered and prescription drug management:  I ordered medication including afrin  for sx  Reevaluation of the patient after these medicines showed that the patient improved I have reviewed the patients home medicines and have made adjustments as needed   Problem List / ED Course:  Covid-19:  pt is a high risk for complications due to COPD.  He will be started on Paxlovid .  He is oxygenating well.  He is stable for d/c.  Return if worse.    Reevaluation:  After the interventions noted above, I reevaluated the patient and found that they have :improved   Social Determinants of Health:  Lives at home   Dispostion:  After consideration of the diagnostic results and the patients response to treatment, I feel that the patent would benefit from discharge with outpatient f/u.  Roger Lewis was evaluated in Emergency Department on 05/01/2024 for the symptoms described in the history of present illness. He was evaluated in the context of the global COVID-19 pandemic, which necessitated consideration that the patient might be at risk for infection with the SARS-CoV-2 virus that causes COVID-19. Institutional protocols and algorithms that pertain to the evaluation of patients at risk for COVID-19 are in a state of rapid change based on information released by regulatory bodies including the CDC and federal and state organizations. These policies and algorithms were followed during the  patient's care in the ED.        Final diagnoses:  COVID-19    ED Discharge Orders          Ordered    nirmatrelvir /ritonavir  (PAXLOVID , 300/100,) 20 x 150 MG & 10 x 100MG  TBPK  2 times daily        05/01/24 1504    predniSONE  (DELTASONE ) 50 MG tablet  Daily with breakfast        05/01/24 1504               Dean Clarity, MD 05/01/24 1515

## 2024-05-06 ENCOUNTER — Other Ambulatory Visit: Payer: Self-pay | Admitting: Family Medicine

## 2024-05-06 ENCOUNTER — Ambulatory Visit
Admission: RE | Admit: 2024-05-06 | Discharge: 2024-05-06 | Disposition: A | Discharge status: Home / Self Care | Source: Ambulatory Visit | Attending: Family Medicine | Admitting: Family Medicine

## 2024-05-06 DIAGNOSIS — S22000A Wedge compression fracture of unspecified thoracic vertebra, initial encounter for closed fracture: Secondary | ICD-10-CM | POA: Insufficient documentation

## 2024-05-06 DIAGNOSIS — M810 Age-related osteoporosis without current pathological fracture: Secondary | ICD-10-CM | POA: Diagnosis not present

## 2024-05-06 DIAGNOSIS — S22060D Wedge compression fracture of T7-T8 vertebra, subsequent encounter for fracture with routine healing: Secondary | ICD-10-CM

## 2024-05-10 ENCOUNTER — Other Ambulatory Visit: Payer: Self-pay | Admitting: Family Medicine

## 2024-05-10 ENCOUNTER — Ambulatory Visit: Payer: Self-pay | Admitting: Family Medicine

## 2024-05-10 DIAGNOSIS — M810 Age-related osteoporosis without current pathological fracture: Secondary | ICD-10-CM | POA: Insufficient documentation

## 2024-05-12 ENCOUNTER — Other Ambulatory Visit (INDEPENDENT_AMBULATORY_CARE_PROVIDER_SITE_OTHER)

## 2024-05-12 ENCOUNTER — Ambulatory Visit: Payer: Self-pay | Admitting: Family Medicine

## 2024-05-12 DIAGNOSIS — M810 Age-related osteoporosis without current pathological fracture: Secondary | ICD-10-CM | POA: Diagnosis not present

## 2024-05-12 LAB — BASIC METABOLIC PANEL WITH GFR
BUN: 12 mg/dL (ref 6–23)
CO2: 34 meq/L — ABNORMAL HIGH (ref 19–32)
Calcium: 8.7 mg/dL (ref 8.4–10.5)
Chloride: 97 meq/L (ref 96–112)
Creatinine, Ser: 0.6 mg/dL (ref 0.40–1.50)
GFR: 95.33 mL/min (ref >60.00)
Glucose, Bld: 100 mg/dL — ABNORMAL HIGH (ref 70–99)
Potassium: 3.5 meq/L (ref 3.5–5.1)
Sodium: 138 meq/L (ref 135–145)

## 2024-05-12 LAB — VITAMIN D 25 HYDROXY (VIT D DEFICIENCY, FRACTURES): VITD: 33.5 ng/mL (ref 30.00–100.00)

## 2024-05-12 LAB — TSH: TSH: 1.41 u[IU]/mL (ref 0.35–5.50)

## 2024-05-13 LAB — PTH, INTACT AND CALCIUM
Calcium: 9.1 mg/dL (ref 8.6–10.3)
PTH: 51 pg/mL (ref 16–77)

## 2024-05-17 ENCOUNTER — Other Ambulatory Visit: Payer: Self-pay | Admitting: Family Medicine

## 2024-05-17 DIAGNOSIS — S22060D Wedge compression fracture of T7-T8 vertebra, subsequent encounter for fracture with routine healing: Secondary | ICD-10-CM

## 2024-05-19 ENCOUNTER — Other Ambulatory Visit: Payer: Self-pay | Admitting: Family Medicine

## 2024-05-19 NOTE — Telephone Encounter (Signed)
 LOV: 01/02/24 NOV: nothing scheduled Last Refill: HYDROcodone -acetaminophen  (NORCO/VICODIN) 5-325 MG tablet 05/07/24 30 tablets 0 refills

## 2024-05-19 NOTE — Telephone Encounter (Signed)
Please get patient scheduled for follow up. Thanks! 

## 2024-05-20 ENCOUNTER — Ambulatory Visit (INDEPENDENT_AMBULATORY_CARE_PROVIDER_SITE_OTHER): Admitting: Family Medicine

## 2024-05-20 ENCOUNTER — Encounter: Payer: Self-pay | Admitting: Family Medicine

## 2024-05-20 VITALS — BP 132/60 | HR 72 | Temp 98.4°F | Ht 68.0 in | Wt 160.8 lb

## 2024-05-20 DIAGNOSIS — M810 Age-related osteoporosis without current pathological fracture: Secondary | ICD-10-CM

## 2024-05-20 DIAGNOSIS — N210 Calculus in bladder: Secondary | ICD-10-CM | POA: Diagnosis not present

## 2024-05-20 DIAGNOSIS — H029 Unspecified disorder of eyelid: Secondary | ICD-10-CM

## 2024-05-20 DIAGNOSIS — S22060D Wedge compression fracture of T7-T8 vertebra, subsequent encounter for fracture with routine healing: Secondary | ICD-10-CM | POA: Diagnosis not present

## 2024-05-20 DIAGNOSIS — L989 Disorder of the skin and subcutaneous tissue, unspecified: Secondary | ICD-10-CM

## 2024-05-20 DIAGNOSIS — R0982 Postnasal drip: Secondary | ICD-10-CM

## 2024-05-20 DIAGNOSIS — M62838 Other muscle spasm: Secondary | ICD-10-CM | POA: Diagnosis not present

## 2024-05-20 DIAGNOSIS — Z1211 Encounter for screening for malignant neoplasm of colon: Secondary | ICD-10-CM

## 2024-05-20 MED ORDER — TAMSULOSIN HCL 0.4 MG PO CAPS: 0.4000 mg | ORAL_CAPSULE | Freq: Every day | ORAL | 3 refills | Status: DC | PRN

## 2024-05-20 MED ORDER — TIZANIDINE HCL 4 MG PO TABS: ORAL_TABLET | ORAL | 2 refills | Status: AC

## 2024-05-20 NOTE — Telephone Encounter (Signed)
 Please call patient to schedule him for CPE thank you

## 2024-05-20 NOTE — Progress Notes (Unsigned)
 D/w pt about getting tetanus shot at the pharmacy.    D/w pt about back pain.  Using hydrocodone .  More activity---> more pain.  More pain standing and leaning over, ie washing dishes.  Pain to the R of the midline T spine.  He is out of the brace.    He quit smoking.  D/w pt about exercise and vit D.    D/w pt about DXA results and osteoporosis path/phys in general, including vit D and calcium.  Reasonable to consider treatment with bisphosphonate, ie fosamax.  D/w pt about risk benefit, especially GI sx, jaw and long bone pathology.   She'll consider and update me as needed.   >15 minutes spent in face to face time with patient, >50% spent in counselling or coordination of care.    He is considering dental extractions.    D/w patient mz:neupnwd for colon cancer screening, including IFOB vs. colonoscopy.  Risks and benefits of both were discussed and patient voiced understanding.  Pt elects for: cologuard.   3mm rough lesion on the B forearms.  They feel like AKs.  He opted for observation.    He has droopy eyelids that are affecting his vision.  Also causing irritation from the eyelashes.    He is still dealing with a likely bladder stone that he hasn't passed.    He is still dealing with covid, better than prior, but still with post nasal gtt.  Breathing is better.

## 2024-05-20 NOTE — Patient Instructions (Addendum)
 Please see about getting a tetanus shot at the pharmacy.  Take care.  Glad to see you.  I would take 1000 international units of vit D per day.   Check with the dental clinic.  Once you have any extractions taken care of, let me know.  Fosamax may be a reasonable option and I would likely try that assuming your heartburn didn't get worse.     Let me know if you can't get set up with [facility] ophthalmology  [phone]  [address] [place], [place] [address]

## 2024-05-23 DIAGNOSIS — R0982 Postnasal drip: Secondary | ICD-10-CM | POA: Insufficient documentation

## 2024-05-23 DIAGNOSIS — H029 Unspecified disorder of eyelid: Secondary | ICD-10-CM | POA: Insufficient documentation

## 2024-05-23 DIAGNOSIS — L989 Disorder of the skin and subcutaneous tissue, unspecified: Secondary | ICD-10-CM | POA: Insufficient documentation

## 2024-05-23 NOTE — Assessment & Plan Note (Addendum)
 Reasonable to consider bisphosphonate treatment with routine cautions discussed with patient, however he is planning on potentially having dental extractions done so I think it makes sense to get done with a dental evaluation before starting any medication.  Rationale discussed with patient.  He understood and he can update me as needed, i.e. after dental evaluation.

## 2024-05-23 NOTE — Assessment & Plan Note (Signed)
 These look like very small areas of actinic keratosis.  They are not ulcerated.  This point okay for outpatient follow-up.  Opted cryotherapy today.  Patient wanted to observe for now.  Routine cautions given to patient.

## 2024-05-23 NOTE — Assessment & Plan Note (Signed)
 Noted since recent COVID infection, with persisting symptoms.  Routine cautions given to patient.  I expect this to gradually improve with more time.

## 2024-05-23 NOTE — Assessment & Plan Note (Signed)
 Cologuard ordered

## 2024-05-23 NOTE — Assessment & Plan Note (Signed)
 -  Refer to ophthalmology

## 2024-05-23 NOTE — Assessment & Plan Note (Signed)
 Encourage urology follow-up.

## 2024-05-23 NOTE — Assessment & Plan Note (Signed)
 With osteoporosis.  Reasonable to consider bisphosphonate treatment with routine cautions discussed with patient, however he is planning on potentially having dental extractions done so I think it makes sense to get done with a dental evaluation before starting any medication.  Rationale discussed with patient.  He understood and he can update me as needed.

## 2024-06-01 ENCOUNTER — Other Ambulatory Visit: Payer: Self-pay | Admitting: Family Medicine

## 2024-06-01 DIAGNOSIS — S22060D Wedge compression fracture of T7-T8 vertebra, subsequent encounter for fracture with routine healing: Secondary | ICD-10-CM

## 2024-06-04 NOTE — Telephone Encounter (Signed)
 LOV: 05/20/2024 NOV:06/2024 Last Refill: HYDROcodone -acetaminophen  (NORCO/VICODIN) 5-325 MG tablet 05/19/24 30 tablets 0 refill

## 2024-06-10 ENCOUNTER — Ambulatory Visit: Payer: Medicare Other

## 2024-06-10 VITALS — Ht 68.0 in | Wt 160.0 lb

## 2024-06-10 DIAGNOSIS — Z0000 Encounter for general adult medical examination without abnormal findings: Secondary | ICD-10-CM | POA: Diagnosis not present

## 2024-06-10 NOTE — Progress Notes (Cosign Needed Addendum)
 Please attest and cosign this visit due to patients primary care provider not being in the office at the time the visit was completed.    Subjective:   Roger Lewis is a 73 y.o. who presents for a Medicare Wellness preventive visit.  As a reminder, Annual Wellness Visits don't include a physical exam, and some assessments may be limited, especially if this visit is performed virtually. We may recommend an in-person follow-up visit with your provider if needed.  Visit Complete: Virtual I connected with  Roger Lewis on 06/10/24 by a audio enabled telemedicine application and verified that I am speaking with the correct person using two identifiers.  Patient Location: Home  Provider Location: Office/Clinic  I discussed the limitations of evaluation and management by telemedicine. The patient expressed understanding and agreed to proceed.  Vital Signs: Because this visit was a virtual/telehealth visit, some criteria may be missing or patient reported. Any vitals not documented were not able to be obtained and vitals that have been documented are patient reported.  VideoDeclined- This patient declined Librarian, academic. Therefore the visit was completed with audio only.  Persons Participating in Visit: Patient.  AWV Questionnaire: No: Patient Medicare AWV questionnaire was not completed prior to this visit.  Cardiac Risk Factors include: advanced age (>45men, >73 women);male gender;sedentary lifestyle     Objective:    Today's Vitals   06/10/24 1018  Weight: 160 lb (72.6 kg)  Height: 5' 8 (1.727 m)   Body mass index is 24.33 kg/m.     06/10/2024   10:33 AM 05/01/2024    1:26 PM 10/05/2023    2:58 PM 06/09/2023   10:13 AM 04/11/2023    4:23 PM 06/05/2022   10:19 AM 05/26/2021   10:18 AM  Advanced Directives  Does Patient Have a Medical Advance Directive? No No No No No No No  Would patient like information on creating a medical advance  directive?  No - Patient declined  No - Patient declined  No - Patient declined Yes (Inpatient - patient defers creating a medical advance directive and declines information at this time)    Current Medications (verified) Outpatient Encounter Medications as of 06/10/2024  Medication Sig   acetaminophen  (TYLENOL ) 325 MG tablet Take 325 mg by mouth 3 (three) times daily as needed.   albuterol  (VENTOLIN  HFA) 108 (90 Base) MCG/ACT inhaler USE 1 TO 2 INHALATIONS BY MOUTH  EVERY 6 HOURS AS NEEDED FOR  SHORTNESS OF BREATH OR WHEEZING   Budeson-Glycopyrrol-Formoterol (BREZTRI  AEROSPHERE) 160-9-4.8 MCG/ACT AERO Inhale 2 puffs into the lungs in the morning and at bedtime.   cholecalciferol (VITAMIN D3) 25 MCG (1000 UNIT) tablet Take 1,000 Units by mouth daily.   fluticasone  (FLONASE ) 50 MCG/ACT nasal spray Place 2 sprays into both nostrils daily as needed for allergies or rhinitis.   HYDROcodone -acetaminophen  (NORCO/VICODIN) 5-325 MG tablet Take 1 tablet by mouth 3 (three) times daily as needed for moderate pain (pain score 4-6).   ibuprofen  (ADVIL ) 200 MG tablet Take 200 mg by mouth 3 (three) times daily as needed (with food.).   Multiple Vitamin (MULTIVITAMIN) tablet Take 1 tablet by mouth daily.   tamsulosin  (FLOMAX ) 0.4 MG CAPS capsule Take 1 capsule (0.4 mg total) by mouth daily as needed.   tiZANidine  (ZANAFLEX ) 4 MG tablet Take 1/2 tablet at bedtime prn muscle spasm   triamcinolone  cream (KENALOG ) 0.5 % Apply 1 Application topically 2 (two) times daily as needed. Only use as needed on affected  area (Patient not taking: Reported on 06/10/2024)   No facility-administered encounter medications on file as of 06/10/2024.    Allergies (verified) Penicillins and Flexeril  [cyclobenzaprine ]   History: Past Medical History:  Diagnosis Date   COPD (chronic obstructive pulmonary disease) (HCC)    GERD (gastroesophageal reflux disease)    Renal stones    Past Surgical History:  Procedure Laterality Date    HAND SURGERY Right    Family History  Problem Relation Age of Onset   COPD Mother    COPD Father    Prostate cancer Paternal Uncle    Coronary artery disease Brother 26       multiple stents   Colon cancer Neg Hx    Social History   Socioeconomic History   Marital status: Single    Spouse name: Not on file   Number of children: Not on file   Years of education: Not on file   Highest education level: Not on file  Occupational History   Not on file  Tobacco Use   Smoking status: Former    Current packs/day: 0.00    Average packs/day: 1 pack/day for 40.0 years (40.0 ttl pk-yrs)    Types: Cigarettes    Start date: 09/09/1968    Quit date: 09/09/2008    Years since quitting: 15.7   Smokeless tobacco: Never  Substance and Sexual Activity   Alcohol use: No    Alcohol/week: 0.0 standard drinks of alcohol    Comment: Very rare   Drug use: Never   Sexual activity: Not Currently  Other Topics Concern   Not on file  Social History Narrative   Likes to [employer]- keyboard, guitar, drums   Self employed concrete work/excavations   Raises beef cattle   2 kids, 2 step kids.     Divorced.  Lives alone   From [place]   Social Drivers of Health   Financial Resource Strain: Low Risk  (06/10/2024)   Overall Financial Resource Strain (CARDIA)    Difficulty of Paying Living Expenses: Not hard at all  Food Insecurity: No Food Insecurity (06/10/2024)   Hunger Vital Sign    Worried About Running Out of Food in the Last Year: Never true    Ran Out of Food in the Last Year: Never true  Transportation Needs: No Transportation Needs (06/10/2024)   PRAPARE - Administrator, Civil Service (Medical): No    Lack of Transportation (Non-Medical): No  Physical Activity: Insufficiently Active (06/10/2024)   Exercise Vital Sign    Days of Exercise per Week: 1 day    Minutes of Exercise per Session: 60 min  Stress: No Stress Concern Present (06/10/2024)   [employer] of  Occupational Health - Occupational Stress Questionnaire    Feeling of Stress: Not at all  Social Connections: Moderately Integrated (06/10/2024)   Social Connection and Isolation Panel    Frequency of Communication with Friends and Family: More than three times a week    Frequency of Social Gatherings with Friends and Family: Three times a week    Attends Religious Services: More than 4 times per year    Active Member of Clubs or Organizations: Yes    Attends Banker Meetings: More than 4 times per year    Marital Status: Widowed    Tobacco Counseling Counseling given: Not Answered   Clinical Intake:  Pre-visit preparation completed: Yes  Pain : 0-10 Pain Type: Chronic pain Pain Location: Back Pain Orientation: Lower  Pain Descriptors / Indicators: Aching Pain Onset: More than a month ago Pain Frequency: Intermittent Pain Relieving Factors: medications;rest some Effect of Pain on Daily Activities: limits activity  Pain Relieving Factors: medications;rest some  BMI - recorded: 24.33 Nutritional Status: BMI of 19-24  Normal Nutritional Risks: Other (Comment) Diabetes: No  No results found for: HGBA1C   How often do you need to have someone help you when you read instructions, pamphlets, or other written materials from your doctor or pharmacy?: 1 - Never  Interpreter Needed?: No  Comments: lives alone Information entered by :: B.Nicko Daher,LPN   Activities of Daily Living     06/10/2024   10:34 AM  In your present state of health, do you have any difficulty performing the following activities:  Hearing? 1  Vision? 0  Difficulty concentrating or making decisions? 0  Walking or climbing stairs? 0  Dressing or bathing? 0  Doing errands, shopping? 0  Preparing Food and eating ? N  Using the Toilet? N  In the past six months, have you accidently leaked urine? N  Do you have problems with loss of bowel control? N  Managing your Medications? N  Managing  your Finances? N  Housekeeping or managing your Housekeeping? N    Patient Care Team: Cleatus Arlyss RAMAN, MD as PCP - General (Family Medicine) Portia Fireman, OD (Optometry)  I have updated your Care Teams any recent Medical Services you may have received from other providers in the past year.     Assessment:   This is a routine wellness examination for Roger Lewis.  Hearing/Vision screen Hearing Screening - Comments:: Patient says he has some hearing difficulties.(Has left hearing aid) Vision Screening - Comments:: Pt says their vision is good with glasses Dr  Portia   Goals Addressed             This Visit's Progress    COMPLETED: DIET - EAT MORE FRUITS AND VEGETABLES       Patient Stated       06/09/24-Needs to be more aware of his surrounding and not breathe unhealthy air     Patient Stated       I have a execise mat: would like to start exercising and stretches       Depression Screen     06/10/2024   10:29 AM 05/20/2024   11:27 AM 06/09/2023   10:05 AM 06/05/2022   10:17 AM 05/26/2021   10:15 AM 03/22/2021   12:37 PM 11/17/2017    4:26 PM  PHQ 2/9 Scores  PHQ - 2 Score 0 0 0 1 0 0 0  PHQ- 9 Score  3 4 1   0     Fall Risk     06/10/2024   10:24 AM 05/20/2024   11:27 AM 06/09/2023    9:59 AM 06/05/2022   10:20 AM 05/26/2021   10:19 AM  Fall Risk   Falls in the past year? 1 0 0 0 0  Number falls in past yr: 0 0 0 0 0  Injury with Fall? 0 0 0 0 0  Risk for fall due to : No Fall Risks No Fall Risks No Fall Risks No Fall Risks   Follow up Falls prevention discussed;Education provided Falls evaluation completed Falls prevention discussed;Falls evaluation completed Falls prevention discussed;Falls evaluation completed  Falls evaluation completed      Data saved with a previous flowsheet row definition    MEDICARE RISK AT HOME:  Medicare Risk at Home Any stairs in  or around the home?: No If so, are there any without handrails?: Yes Home free of loose throw rugs  in walkways, pet beds, electrical cords, etc?: Yes Adequate lighting in your home to reduce risk of falls?: Yes Life alert?: No Use of a cane, walker or w/c?: No Grab bars in the bathroom?: No Shower chair or bench in shower?: No Elevated toilet seat or a handicapped toilet?: No  TIMED UP AND GO:  Was the test performed?  No  Cognitive Function: 6CIT completed        06/09/2023   10:16 AM 06/09/2023   10:14 AM 06/05/2022   10:22 AM 05/26/2021   10:22 AM  6CIT Screen  What Year? 0 points 0 points 0 points 0 points  What month? 0 points 0 points 0 points 0 points  What time? 0 points  0 points 0 points  Count back from 20 0 points  0 points 0 points  Months in reverse 0 points  0 points 0 points  Repeat phrase 0 points  0 points 0 points  Total Score 0 points  0 points 0 points    Immunizations Immunization History  Administered Date(s) Administered   Fluad Trivalent(High Dose 65+) 06/27/2023   INFLUENZA, HIGH DOSE SEASONAL PF 10/20/2017   Influenza,inj,Quad PF,6+ Mos 07/20/2016   Influenza-Unspecified 10/20/2017, 09/19/2018   Pneumococcal Conjugate-13 11/17/2017   Smallpox 10[birth year]04/1954, 11/08/1954, 06/11/1955    Screening Tests Health Maintenance  Topic Date Due   DTaP/Tdap/Td (1 - Tdap) Never done   Zoster Vaccines- Shingrix (1 of 2) Never done   Fecal DNA (Cologuard)  04/03/2024   Influenza Vaccine  04/09/2024   Pneumococcal Vaccine: 50+ Years (2 of 2 - PPSV23, PCV20, or PCV21) 06/26/2024 (Originally 01/12/2018)   Medicare Annual Wellness (AWV)  06/10/2025   Hepatitis C Screening  Completed   HPV VACCINES  Aged Out   Meningococcal B Vaccine  Aged Out   Lung Cancer Screening  Discontinued   COVID-19 Vaccine  Discontinued    Health Maintenance Items Addressed: None due at this time. Pt will receive vaccines at their pharmcy when decided to obtain  Pt says he has the cologard kit and will be completing and sending in soon  Additional Screening:  Vision Screening:  Recommended annual ophthalmology exams for early detection of glaucoma and other disorders of the eye. Is the patient up to date with their annual eye exam?  Yes  Who is the provider or what is the name of the office in which the patient attends annual eye exams? Dr Portia  Dental Screening: Recommended annual dental exams for proper oral hygiene  Community Resource Referral / Chronic Care Management: CRR required this visit?  No   CCM required this visit?  No   Plan:    I have personally reviewed and noted the following in the patient's chart:   Medical and social history Use of alcohol, tobacco or illicit drugs  Current medications and supplements including opioid prescriptions. Patient is currently taking opioid prescriptions. Information provided to patient regarding non-opioid alternatives. Patient advised to discuss non-opioid treatment plan with their provider. Functional ability and status Nutritional status Physical activity Advanced directives List of other physicians Hospitalizations, surgeries, and ER visits in previous 12 months Vitals Screenings to include cognitive, depression, and falls Referrals and appointments  In addition, I have reviewed and discussed with patient certain preventive protocols, quality metrics, and best practice recommendations. A written personalized care plan for preventive services as well as general preventive  health recommendations were provided to patient.   Roger LITTIE Saris, LPN   [id number]   After Visit Summary: (MyChart) Due to this being a telephonic visit, the after visit summary with patients personalized plan was offered to patient via MyChart   Notes: Nothing significant to report at this time.

## 2024-06-10 NOTE — Patient Instructions (Addendum)
 Roger Lewis,  Thank you for taking the time for your Medicare Wellness Visit. I appreciate your continued commitment to your health goals. Please review the care plan we discussed, and feel free to reach out if I can assist you further.  Medicare recommends these wellness visits once per year to help you and your care team stay ahead of potential health issues. These visits are designed to focus on prevention, allowing your provider to concentrate on managing your acute and chronic conditions during your regular appointments.  Please note that Annual Wellness Visits do not include a physical exam. Some assessments may be limited, especially if the visit was conducted virtually. If needed, we may recommend a separate in-person follow-up with your provider.  Ongoing Care Seeing your primary care provider every 3 to 6 months helps us  monitor your health and provide consistent, personalized care.   Pt requesting Urologist information seen last for follow up. [facility] [address] [place], [place] [address] [phone] Closes 5 PM   Referrals If a referral was made during today's visit and you haven't received any updates within two weeks, please contact the referred provider directly to check on the status.  Recommended Screenings:  Health Maintenance  Topic Date Due   DTaP/Tdap/Td vaccine (1 - Tdap) Never done   Zoster (Shingles) Vaccine (1 of 2) Never done   Cologuard (Stool DNA test)  04/03/2024   Flu Shot  04/09/2024   Pneumococcal Vaccine for age over 72 (2 of 2 - PPSV23, PCV20, or PCV21) 06/26/2024*   Medicare Annual Wellness Visit  06/10/2025   Hepatitis C Screening  Completed   HPV Vaccine  Aged Out   Meningitis B Vaccine  Aged Out   Screening for Lung Cancer  Discontinued   COVID-19 Vaccine  Discontinued  *Topic was postponed. The date shown is not the original due date.       05/01/2024    1:26 PM  Advanced Directives  Does Patient  Have a Medical Advance Directive? No  Would patient like information on creating a medical advance directive? No - Patient declined   Advance Care Planning is important because it: Ensures you receive medical care that aligns with your values, goals, and preferences. Provides guidance to your family and loved ones, reducing the emotional burden of decision-making during critical moments.  Vision: Annual vision screenings are recommended for early detection of glaucoma, cataracts, and diabetic retinopathy. These exams can also reveal signs of chronic conditions such as diabetes and high blood pressure.  Dental: Annual dental screenings help detect early signs of oral cancer, gum disease, and other conditions linked to overall health, including heart disease and diabetes.  Please see the attached documents for additional preventive care recommendations.

## 2024-06-14 ENCOUNTER — Telehealth: Payer: Self-pay

## 2024-06-14 NOTE — Telephone Encounter (Signed)
 Gave pt a call,pt is coming up due for reenrollment on [employer] Breztri ,spoke with pt is aware he will be receiving in the [facility] provider portion today.

## 2024-06-15 DIAGNOSIS — Z1211 Encounter for screening for malignant neoplasm of colon: Secondary | ICD-10-CM | POA: Diagnosis not present

## 2024-06-17 ENCOUNTER — Other Ambulatory Visit: Payer: Self-pay | Admitting: Family Medicine

## 2024-06-20 ENCOUNTER — Ambulatory Visit: Payer: Self-pay | Admitting: Family Medicine

## 2024-06-20 LAB — COLOGUARD: COLOGUARD: NEGATIVE

## 2024-06-21 ENCOUNTER — Other Ambulatory Visit: Payer: Self-pay | Admitting: Family Medicine

## 2024-06-21 DIAGNOSIS — S22060D Wedge compression fracture of T7-T8 vertebra, subsequent encounter for fracture with routine healing: Secondary | ICD-10-CM

## 2024-06-21 NOTE — Telephone Encounter (Signed)
 Received provider portion back from provider office

## 2024-06-21 NOTE — Telephone Encounter (Signed)
 LOV: 05/20/2024 NOV:06/2024 Last Refill: HYDROcodone -acetaminophen  (NORCO/VICODIN) 5-325 MG tablet 06/04/24 30 tablets 0 refill

## 2024-06-28 ENCOUNTER — Encounter: Payer: Self-pay | Admitting: Family Medicine

## 2024-06-28 ENCOUNTER — Ambulatory Visit (INDEPENDENT_AMBULATORY_CARE_PROVIDER_SITE_OTHER): Admitting: Family Medicine

## 2024-06-28 VITALS — BP 134/78 | HR 59 | Temp 97.9°F | Ht 67.5 in | Wt 158.5 lb

## 2024-06-28 DIAGNOSIS — N210 Calculus in bladder: Secondary | ICD-10-CM | POA: Diagnosis not present

## 2024-06-28 DIAGNOSIS — K219 Gastro-esophageal reflux disease without esophagitis: Secondary | ICD-10-CM

## 2024-06-28 DIAGNOSIS — Z125 Encounter for screening for malignant neoplasm of prostate: Secondary | ICD-10-CM

## 2024-06-28 DIAGNOSIS — Z7189 Other specified counseling: Secondary | ICD-10-CM

## 2024-06-28 DIAGNOSIS — R21 Rash and other nonspecific skin eruption: Secondary | ICD-10-CM

## 2024-06-28 DIAGNOSIS — E785 Hyperlipidemia, unspecified: Secondary | ICD-10-CM | POA: Diagnosis not present

## 2024-06-28 DIAGNOSIS — M810 Age-related osteoporosis without current pathological fracture: Secondary | ICD-10-CM | POA: Diagnosis not present

## 2024-06-28 DIAGNOSIS — Z23 Encounter for immunization: Secondary | ICD-10-CM | POA: Diagnosis not present

## 2024-06-28 DIAGNOSIS — J449 Chronic obstructive pulmonary disease, unspecified: Secondary | ICD-10-CM | POA: Diagnosis not present

## 2024-06-28 DIAGNOSIS — M549 Dorsalgia, unspecified: Secondary | ICD-10-CM

## 2024-06-28 DIAGNOSIS — Z0000 Encounter for general adult medical examination without abnormal findings: Secondary | ICD-10-CM

## 2024-06-28 LAB — LIPID PANEL
Cholesterol: 198 mg/dL (ref 0–200)
HDL: 57.7 mg/dL (ref >39.00)
LDL Cholesterol: 120 mg/dL — ABNORMAL HIGH (ref 0–99)
NonHDL: 140.6
Total CHOL/HDL Ratio: 3
Triglycerides: 101 mg/dL (ref 0.0–149.0)
VLDL: 20.2 mg/dL (ref 0.0–40.0)

## 2024-06-28 LAB — HEPATIC FUNCTION PANEL
ALT: 12 U/L (ref 0–53)
AST: 15 U/L (ref 0–37)
Albumin: 4.4 g/dL (ref 3.5–5.2)
Alkaline Phosphatase: 65 U/L (ref 39–117)
Bilirubin, Direct: 0.1 mg/dL (ref 0.0–0.3)
Total Bilirubin: 0.5 mg/dL (ref 0.2–1.2)
Total Protein: 6.8 g/dL (ref 6.0–8.3)

## 2024-06-28 LAB — PSA, MEDICARE: PSA: 0.28 ng/mL (ref 0.10–4.00)

## 2024-06-28 MED ORDER — FAMOTIDINE 20 MG PO TABS: 20.0000 mg | ORAL_TABLET | Freq: Every day | ORAL | 1 refills | Status: DC

## 2024-06-28 NOTE — Progress Notes (Unsigned)
 Back pain d/w pt.  Still using hydrocodone  prn.  Pain is to the R of midline, medial to scapula.  Still taking tizanidine  if needed.  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  He doesn't have new pain but had more pain after he is more active. 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  Ask [name] for long term options- gabapentin?  COPD d/w pt. Still using breztri  at baseline. That helps.  Has seen pulmonary.   He handed in paperwork for assistance today.    Flu today. Shingles d/w pt  PNA 2019 Tetanus d/w pt.  COVID vaccine d/w pt prev.  Cologuard neg 2025 PSA pending 2025 Advance directive- son Evalene designated if patient were incapacitated.  HCV screen prev neg.  He is considering dental extractions and that would potentially change the plan re: osteoporosis.  Discussed- he is considering a second opinion from dental clinic.    We talked about the stress of multiple illnesses this year.    Prev hand dermatitis improved after covid, when he took paxlovid  and prednisone .    He had nocturnal reflux.  D/w pt about options.  HOB is elevation.  He is limiting foot before bed.  Voice is altered.  He had taken some NSAIDs for pain.   Meds, vitals, and allergies reviewed.   ROS: Per HPI unless specifically indicated in ROS section

## 2024-06-28 NOTE — Patient Instructions (Addendum)
 Go to the lab on the way out.   If you have mychart we'll likely use that to update you.    Take care.  Glad to see you.  If the hand symptoms get worse, then let me know.  Let me know if you can't get set up with urology.  Flu shot today.   Try pepcid prior to bedtime and see if that helps.

## 2024-06-30 ENCOUNTER — Telehealth: Payer: Self-pay | Admitting: Family Medicine

## 2024-06-30 NOTE — Assessment & Plan Note (Signed)
 Hand rash improved when he was taking Paxil bid and prednisone .  I asked him to update me as needed.

## 2024-06-30 NOTE — Assessment & Plan Note (Signed)
 Continue Breztri .  Lungs are clear. He handed in paperwork for assistance today.

## 2024-06-30 NOTE — Assessment & Plan Note (Signed)
 He is already limiting fluid at night and the head of his bed is elevated. Reasonable to try pepcid prior to bedtime and see if that helps.

## 2024-06-30 NOTE — Assessment & Plan Note (Signed)
 See phone phone note. I am sending a note to neurosurgery about long-term pain options.  Gabapentin may be reasonable.    continue as needed hydrocodone  for now. Not sedated.  Okay for outpatient follow-up.

## 2024-06-30 NOTE — Assessment & Plan Note (Signed)
 Refer to urology.  ?

## 2024-06-30 NOTE — Assessment & Plan Note (Signed)
Advance directive- son Timothy designated if patient were incapacitated.  

## 2024-06-30 NOTE — Assessment & Plan Note (Signed)
 Flu today. Shingles d/w pt  PNA 2019 Tetanus d/w pt.  COVID vaccine d/w pt prev.  Cologuard neg 2025 PSA pending 2025 Advance directive- son Evalene designated if patient were incapacitated.  HCV screen prev neg.

## 2024-06-30 NOTE — Assessment & Plan Note (Signed)
 He is considering dental extractions and that would potentially change the plan re: osteoporosis.  Discussed- he is considering a second opinion from dental clinic.   I asked him to update me after he gets input from the dental clinic.

## 2024-06-30 NOTE — Telephone Encounter (Addendum)
 This patient had previous fracture and is still having pain.  I am awaiting an update about his dental status before we start osteoporosis treatment.  He is still taking hydrocodone  as needed.  I was going to start gabapentin unless you had other guidance or needed to recheck the patient.  I would appreciate your input either way.  Many thanks.

## 2024-07-01 ENCOUNTER — Other Ambulatory Visit: Payer: Self-pay | Admitting: Family Medicine

## 2024-07-01 DIAGNOSIS — S22060D Wedge compression fracture of T7-T8 vertebra, subsequent encounter for fracture with routine healing: Secondary | ICD-10-CM

## 2024-07-01 NOTE — Telephone Encounter (Signed)
 Received pt portion [employer] Breztri , faxed to [employer] today will follow up in a few days.

## 2024-07-01 NOTE — Telephone Encounter (Signed)
 Duplicate request (see 06/21/24 refill note). Rx sent 06/22/24, #30/0 refills to [facility].

## 2024-07-01 NOTE — Telephone Encounter (Signed)
 I would like to see him back since he is still having so much pain. Will likely consider MRI scan for further evaluation. Okay to start neurontin to help with pain.   I'll have my staff reach out to him. Thanks for letting me know.

## 2024-07-01 NOTE — Telephone Encounter (Signed)
 Please call and get him scheduled to see me. Have him get thoracic xrays prior to the visit.

## 2024-07-02 NOTE — Telephone Encounter (Signed)
 Sent. Thanks.

## 2024-07-04 ENCOUNTER — Ambulatory Visit: Payer: Self-pay | Admitting: Family Medicine

## 2024-07-04 MED ORDER — GABAPENTIN 100 MG PO CAPS: 100.0000 mg | ORAL_CAPSULE | Freq: Two times a day (BID) | ORAL | 1 refills | Status: AC

## 2024-07-04 NOTE — Addendum Note (Signed)
 Addended by: CLEATUS ARLYSS RAMAN on: 07/04/2024 07:59 PM   Modules accepted: Orders

## 2024-07-04 NOTE — Telephone Encounter (Signed)
 See below.  Reasonable to have him follow-up with the spine clinic.  I would try taking gabapentin.  Start with 100 mg at night and gradually increase up to 300 mg at night.  If needed he could then increase up to 300 mg twice a day, as long as it did not make him drowsy.  I sent the prescription in the meantime.  Please have him update us  as needed.  Thanks.

## 2024-07-05 NOTE — Telephone Encounter (Signed)
Spoke with pt relaying Dr. Lianne Bushy message.  Pt verbalizes understanding and expresses his thanks.

## 2024-07-06 DIAGNOSIS — H02831 Dermatochalasis of right upper eyelid: Secondary | ICD-10-CM | POA: Diagnosis not present

## 2024-07-06 DIAGNOSIS — H5203 Hypermetropia, bilateral: Secondary | ICD-10-CM | POA: Diagnosis not present

## 2024-07-06 DIAGNOSIS — H524 Presbyopia: Secondary | ICD-10-CM | POA: Diagnosis not present

## 2024-07-06 DIAGNOSIS — H02834 Dermatochalasis of left upper eyelid: Secondary | ICD-10-CM | POA: Diagnosis not present

## 2024-07-06 DIAGNOSIS — H2513 Age-related nuclear cataract, bilateral: Secondary | ICD-10-CM | POA: Diagnosis not present

## 2024-07-06 DIAGNOSIS — H52223 Regular astigmatism, bilateral: Secondary | ICD-10-CM | POA: Diagnosis not present

## 2024-07-13 ENCOUNTER — Telehealth: Payer: Self-pay

## 2024-07-13 ENCOUNTER — Encounter: Payer: Self-pay | Admitting: Pulmonary Disease

## 2024-07-13 ENCOUNTER — Ambulatory Visit: Admitting: Pulmonary Disease

## 2024-07-13 VITALS — BP 122/72 | HR 67 | Temp 98.1°F | Ht 67.5 in | Wt 162.6 lb

## 2024-07-13 DIAGNOSIS — R911 Solitary pulmonary nodule: Secondary | ICD-10-CM

## 2024-07-13 DIAGNOSIS — J439 Emphysema, unspecified: Secondary | ICD-10-CM | POA: Diagnosis not present

## 2024-07-13 DIAGNOSIS — J4489 Other specified chronic obstructive pulmonary disease: Secondary | ICD-10-CM

## 2024-07-13 NOTE — Progress Notes (Signed)
 Synopsis: Referred in  by Cleatus Arlyss RAMAN, MD   Subjective:   PATIENT ID: Roger Lewis GENDER: male DOB: Nov 29, 1950, MRN: [id number]  Chief Complaint  Patient presents with   COPD    Better. DOE. No wheezing or cough.  Breztri - BID helps with his breathing. Albuterol - 2-3 times a day    HPI Roger Lewis is a 73 year old male patient with a past medical history of clinical COPD on Stiolto previous patient of Dr. Darlean presenting today to the pulmonary clinic to establish care.  He reports that his shortness of breath has been worsening over the past year.  Using albuterol  on a daily basis as needed couple of times.  Has been hospitalized only once for COPD exacerbation and has required systemic steroids only once for COPD exacerbation. He does report 10 to 15lbs unintentional weightloss in the past year.   Last chest x-ray in 2023 with increased reticular nodular opacities with superimposed emphysema.  01/07/2024 - Patient feeling overall well. Doing well on Breztri . PFTs done today with Stage III COPD.    07/13/2024 - Roger Lewis is doing well overall. He is compliant with his Breztri . Still experiences dyspnea on exertion specially when he tries to walk fast. 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 Discussed adding Ohtuvare to his regimen and he is agreeable.   Family history -no family history of lung cancer.  Social history -quit smoking in 2012.  Smoked 1 pack/day for 50 years.  ROS All systems were reviewed and are negative except for the above.  Objective:   Vitals:   07/13/24 0921  BP: 122/72  Pulse: 67  Temp: 98.1 F (36.7 C)  SpO2: 96%  Weight: 162 lb 9.6 oz (73.8 kg)  Height: 5' 7.5 (1.715 m)    96% on RA BMI Readings from Last 3 Encounters:  07/13/24 25.09 kg/m  06/28/24 24.46 kg/m  06/10/24 24.33 kg/m   Wt Readings from Last 3 Encounters:  07/13/24 162 lb 9.6 oz (73.8 kg)  06/28/24 158 lb 8 oz (71.9 kg)  06/10/24 160 lb (72.6 kg)     Physical Exam GEN: NAD, Healthy Appearing HEENT: Supple Neck, Reactive Pupils, EOMI  CVS: Normal S1, Normal S2, RRR, No murmurs or ES appreciated  Lungs: Biateral expiratory wheezing.  Abdomen: Soft, non tender, non distended, + BS  Extremities: Warm and well perfused, No edema  Skin: No suspicious lesions appreciated  Psych: Normal Affect  Ancillary Information   CBC    Component Value Date/Time   WBC 6.1 10/05/2023 1737   RBC 4.25 10/05/2023 1737   HGB 12.9 (L) 10/05/2023 1737   HCT 37.2 (L) 10/05/2023 1737   PLT 228 10/05/2023 1737   MCV 87.5 10/05/2023 1737   MCH 30.4 10/05/2023 1737   MCHC 34.7 10/05/2023 1737   RDW 12.9 10/05/2023 1737   LYMPHSABS 1.3 10/05/2023 1737   MONOABS 0.7 10/05/2023 1737   EOSABS 0.1 10/05/2023 1737   BASOSABS 0.1 10/05/2023 1737    Labs and imaging were reviewed.     Latest Ref Rng & Units 01/07/2024   12:24 PM  PFT Results  FVC-Pre L 2.17   FVC-Predicted Pre % 54   FVC-Post L 2.32   FVC-Predicted Post % 58   Pre FEV1/FVC % % 42   Post FEV1/FCV % % 38   FEV1-Pre L 0.91   FEV1-Predicted Pre % 31   FEV1-Post L 0.89   DLCO uncorrected ml/min/mmHg 8.87   DLCO UNC% % 37  DLVA Predicted % 58   TLC L 8.08   TLC % Predicted % 121   RV % Predicted % 276      Assessment & Plan:  Roger Lewis is a 72 year old male patient with a past medical history of clinical COPD on Stiolto previous patient of Dr. Darlean presenting today to the pulmonary clinic to establish care.  #COPD Stage III with emphysema  DOE worsening with years. Unintentional 10lbs weight loss.  c/wBudesonide-Formoterol-Glycopyrrolate [Breztri ] 2 puffs twice a day. Provided Aerochamber. Advised mouth rinsing after each use.  []  C/w Albuterol  as needed. []  Start [place].    #RUL lung nodule measuring 6 mm.  []  CT chest in July 2025 reassuring - largest nodule measuriong 4 mm.   []  Plan to enroll in LDCT.    RTC 6 months.   I personally spent a total of 30  minutes in the care of the patient today including preparing to see the patient, getting/reviewing separately obtained history, performing a medically appropriate exam/evaluation, counseling and educating, placing orders, documenting clinical information in the EHR, independently interpreting results, and communicating results.   Darrin Barn, MD [facility] 07/13/2024 9:44 AM

## 2024-07-13 NOTE — Telephone Encounter (Signed)
 Patient was seen in the office today. Dr. Larinda Buttery has ordered [facility] for the patient.  He has signed the form and it has been faxed to the pharmacy team for completion.

## 2024-07-14 ENCOUNTER — Telehealth: Payer: Self-pay

## 2024-07-14 ENCOUNTER — Other Ambulatory Visit: Payer: Self-pay | Admitting: Family Medicine

## 2024-07-14 DIAGNOSIS — S22060D Wedge compression fracture of T7-T8 vertebra, subsequent encounter for fracture with routine healing: Secondary | ICD-10-CM

## 2024-07-14 NOTE — Telephone Encounter (Signed)
 Received fax from [employer] confirming receipt of enrollment form.   Patient ID: [id number]

## 2024-07-14 NOTE — Telephone Encounter (Signed)
 Sent.  Please find out when he is going to see the spine clinic and see about response to gabapentin for paint.  Thanks.

## 2024-07-14 NOTE — Telephone Encounter (Signed)
 Per telephone encounter from 11/5. The pharmacy team has received the form.  Nothing further needed.

## 2024-07-14 NOTE — Telephone Encounter (Signed)
 Received Ohtuvayre  new start paperwork. Completed form and faxed with clinicals and insurance card copy to [facility]   Phone#: [phone] Fax#: [phone]

## 2024-07-16 NOTE — Telephone Encounter (Signed)
 Spoke to pt and he stated he has not made the appointment yet and stated he will make the appointment soon

## 2024-07-16 NOTE — Telephone Encounter (Signed)
 Received fax from [employer] with summary of benefits. Referral form for Ohtuvayre  received. Rx will be triaged to [facility].. Once benefits investigation completed, pharmacy will reach out the patient to schedule shipment. If medication is unaffordable, patient will need to express financial hardship to be referred back to [facility] for patient assistance program pre-screening.   Patient ID: [id number] Pharmacy phone: [phone] [facility] Phone#: [phone]

## 2024-07-18 NOTE — Telephone Encounter (Signed)
 Noted. Did gabapentin help the pain?

## 2024-07-19 NOTE — Telephone Encounter (Signed)
 Received fax from [facility] confirming receipt of prescription. Pharmacy will be contacting patient shortly to coordinate delivery.

## 2024-07-22 ENCOUNTER — Other Ambulatory Visit: Payer: Self-pay | Admitting: Family Medicine

## 2024-07-22 DIAGNOSIS — J441 Chronic obstructive pulmonary disease with (acute) exacerbation: Secondary | ICD-10-CM

## 2024-07-22 DIAGNOSIS — R0789 Other chest pain: Secondary | ICD-10-CM

## 2024-07-26 ENCOUNTER — Other Ambulatory Visit: Payer: Self-pay | Admitting: Family Medicine

## 2024-07-26 DIAGNOSIS — S22060D Wedge compression fracture of T7-T8 vertebra, subsequent encounter for fracture with routine healing: Secondary | ICD-10-CM

## 2024-07-26 NOTE — Progress Notes (Unsigned)
 Referring Physician:  Cleatus Arlyss RAMAN, MD [address] [place],  [place] [address]  Primary Physician:  Roger Arlyss RAMAN, MD  History of Present Illness: [name] has a history of COPD, GERD.   Last seen by me on 02/05/24 for T8 compression fracture that likely occurred on 10/03/23 when he was lifting hay bales. He has known chronic vertebral height loss of T7 and T9.   His PCP sent me a message that he was still having significant pain. He started him on neurontin. CT of chest on 04/05/24 showed chronic T7 and T8 compression fractures.   He had DEXA on 05/06/24 that showed osteoporosis.   He is here for follow up.   He continues with constant mid back pain with no arm or leg pain. No numbness, tingling, or weakness. Pain is worse with increased activity. He wear brace at home as it helps. Pain also worse with any lifting.   He has not started neurontin yet. He takes prn norco and zanaflex .   He quit smoking in 2010.   Bowel/Bladder Dysfunction: none  The symptoms are causing a significant impact on the patient's life.   Review of Systems:  A 10 point review of systems is negative, except for the pertinent positives and negatives detailed in the HPI.  Past Medical History: Past Medical History:  Diagnosis Date   COPD (chronic obstructive pulmonary disease) (HCC)    GERD (gastroesophageal reflux disease)    Renal stones     Past Surgical History: Past Surgical History:  Procedure Laterality Date   HAND SURGERY Right     Allergies: Allergies as of 07/27/2024 - Review Complete 07/27/2024  Allergen Reaction Noted   Penicillins Rash 09/05/2015   Flexeril  [cyclobenzaprine ] Other (See Comments) 11/17/2017    Medications: Outpatient Encounter Medications as of 07/27/2024  Medication Sig   acetaminophen  (TYLENOL ) 325 MG tablet Take 325 mg by mouth 3 (three) times daily as needed.   albuterol  (VENTOLIN  HFA) 108 (90 Base) MCG/ACT inhaler USE 1 TO 2 INHALATIONS BY  MOUTH  EVERY 6 HOURS AS NEEDED FOR  SHORTNESS OF BREATH OR WHEEZING   Budeson-Glycopyrrol-Formoterol (BREZTRI  AEROSPHERE) 160-9-4.8 MCG/ACT AERO Inhale 2 puffs into the lungs in the morning and at bedtime.   cholecalciferol (VITAMIN D3) 25 MCG (1000 UNIT) tablet Take 1,000 Units by mouth daily.   famotidine (PEPCID) 20 MG tablet Take 1 tablet (20 mg total) by mouth at bedtime.   fluticasone  (FLONASE ) 50 MCG/ACT nasal spray Place 2 sprays into both nostrils daily as needed for allergies or rhinitis.   gabapentin (NEURONTIN) 100 MG capsule Take 1-3 capsules (100-300 mg total) by mouth 2 (two) times daily.   HYDROcodone -acetaminophen  (NORCO/VICODIN) 5-325 MG tablet Take 1 tablet by mouth 3 (three) times daily as needed for moderate pain (pain score 4-6).   ibuprofen  (ADVIL ) 200 MG tablet Take 200 mg by mouth 3 (three) times daily as needed (with food.).   Multiple Vitamin (MULTIVITAMIN) tablet Take 1 tablet by mouth daily.   tamsulosin  (FLOMAX ) 0.4 MG CAPS capsule Take 1 capsule (0.4 mg total) by mouth daily as needed.   tiZANidine  (ZANAFLEX ) 4 MG tablet Take 1/2 tablet at bedtime prn muscle spasm   No facility-administered encounter medications on file as of 07/27/2024.    Social History: Social History   Tobacco Use   Smoking status: Former    Current packs/day: 0.00    Average packs/day: 1 pack/day for 40.0 years (40.0 ttl pk-yrs)    Types: Cigarettes  Start date: 09/09/1968    Quit date: 09/09/2008    Years since quitting: 15.8   Smokeless tobacco: Never  Substance Use Topics   Alcohol use: No    Alcohol/week: 0.0 standard drinks of alcohol    Comment: Very rare   Drug use: Never    Family Medical History: Family History  Problem Relation Age of Onset   COPD Mother    COPD Father    Prostate cancer Paternal Uncle    Coronary artery disease Brother 32       multiple stents   Colon cancer Neg Hx     Physical Examination: Vitals:   07/27/24 1030 07/27/24 1055  BP: (!)  154/94 132/70    Awake, alert, oriented to person, place, and time.  Speech is clear and fluent. Fund of knowledge is appropriate.   Cranial Nerves: Pupils equal round and reactive to light.  Facial tone is symmetric.    He has midline tenderness around T7-T8  No abnormal lesions on exposed skin.   Strength: Side Biceps Triceps Deltoid Interossei Grip Wrist Ext. Wrist Flex.  R 5 5 5 5 5 5 5   L 5 5 5 5 5 5 5    Side Iliopsoas Quads Hamstring PF DF EHL  R 5 5 5 5 5 5   L 5 5 5 5 5 5    Reflexes are 2+ and symmetric at the biceps, brachioradialis, patella and achilles.   Hoffman's is absent.  Clonus is not present.   Bilateral upper and lower extremity sensation is intact to light touch.     Gait is normal.    Medical Decision Making  Imaging: none   Assessment and Plan: [name] has constant mid back pain with no arm or leg pain. No numbness, tingling, or weakness. Pain is worse with increased activity. He wears brace at home as it helps. Pain also worse with any lifting.   He has known compression fracture T7 and T8 that appeared stable on previous xrays from 02/05/24.   Treatment options discussed with patient and following plan made:   - Thoracic xrays on his way out. Will message him with results.  - MRI of thoracic spine to further evaluate fractures at T7 and T8.  - Continue with prn norco and prn zanaflex  from PCP.  - Will plan for phone visit to review his thoracic MRI results. Will schedule this once I have the results.   I spent a total of 25 minutes in face-to-face and non-face-to-face activities related to this patient's care today including review of outside records, review of imaging, review of symptoms, physical exam, discussion of differential diagnosis, discussion of treatment options, and documentation.   Roger Boys PA-C Dept. of Neurosurgery

## 2024-07-26 NOTE — Telephone Encounter (Signed)
 Spoke to patient - Patient reports he may have blocked [facility] phone number.  Explained the process of obtaining Ohtuvayre  through [facility]. If copay is not affordable, he should be referred to [facility] to see if he qualifies for cost assistance.   Provided contact information below.  Pharmacy phone: [phone] [facility] Phone#: [phone]   Patient verbalizes understanding and agreement with plan.

## 2024-07-27 ENCOUNTER — Other Ambulatory Visit

## 2024-07-27 ENCOUNTER — Encounter: Payer: Self-pay | Admitting: Orthopedic Surgery

## 2024-07-27 ENCOUNTER — Ambulatory Visit: Admitting: Orthopedic Surgery

## 2024-07-27 VITALS — BP 132/70 | Ht 67.5 in | Wt 162.0 lb

## 2024-07-27 DIAGNOSIS — S22069D Unspecified fracture of T7-T8 vertebra, subsequent encounter for fracture with routine healing: Secondary | ICD-10-CM | POA: Diagnosis not present

## 2024-07-27 NOTE — Telephone Encounter (Signed)
 Name of Medication:  Hydrocodone -APAP Name of Pharmacy:  [facility] Last Fill or Written Date and Quantity:  07/14/24, #30 Last Office Visit and Type:  06/28/24, annual exam Next Office Visit and Type:  none Last Controlled Substance Agreement Date:  none Last UDS:  none

## 2024-07-27 NOTE — Patient Instructions (Signed)
 It was so nice to see you today. Thank you so much for coming in.    I want to get xrays of your mid back today. Will message you with results.   I want to get an MRI of your mid back to look into things further. We will get this approved through your insurance and [facility] will call you to schedule the appointment. Ask about your patient responsibility. You do not need to pay this prior to getting MRI, they can bill you.   [facility] is located at [address] in [place]. This is near the intersection of [address] and [address].   After you have the MRI, it can take 14-28 days for me to get the results back. If I don't have them in 2 weeks, we will call to try to get the results.   Once I have the results, we will call you to schedule a follow up phone visit with me to review them.   Please do not hesitate to call if you have any questions or concerns. You can also message me in MyChart.   [name] PA-C [phone]     The physicians and staff at [facility] at [facility] are committed to providing excellent care. You may receive a survey asking for feedback about your experience at our office. We value you your feedback and appreciate you taking the time to to fill it out. The [facility] leadership team is also available to discuss your experience in person, feel free to contact us  [phone].

## 2024-07-28 ENCOUNTER — Ambulatory Visit
Admission: RE | Admit: 2024-07-28 | Discharge: 2024-07-28 | Disposition: A | Discharge status: Home / Self Care | Source: Ambulatory Visit | Attending: Urology | Admitting: Urology

## 2024-07-28 ENCOUNTER — Ambulatory Visit
Admission: RE | Admit: 2024-07-28 | Discharge: 2024-07-28 | Disposition: A | Source: Ambulatory Visit | Attending: Urology | Admitting: Urology

## 2024-07-28 ENCOUNTER — Encounter: Payer: Self-pay | Admitting: Urology

## 2024-07-28 ENCOUNTER — Ambulatory Visit: Admitting: Urology

## 2024-07-28 VITALS — BP 126/81 | HR 70 | Ht 68.0 in | Wt 164.0 lb

## 2024-07-28 DIAGNOSIS — N210 Calculus in bladder: Secondary | ICD-10-CM

## 2024-07-28 LAB — MICROSCOPIC EXAMINATION

## 2024-07-28 NOTE — Telephone Encounter (Signed)
 Sent. Thanks.

## 2024-07-28 NOTE — H&P (View-Only) (Signed)
 07/28/2024 10:45 AM   Roger Lewis 10-12-1950 [id number]  Referring provider: Cleatus Arlyss RAMAN, MD [address] [place],  [place] [address]  Chief Complaint  Patient presents with   Nephrolithiasis    HPI: Roger Lewis is a 73 y.o. male referred for evaluation of a bladder calculus.  ED visit 04/11/2023 complaining of gross hematuria CT renal stone study was performed which showed a 10 mm bladder calculus.  There was a punctate, nonobstructing right renal calculus.  Does have a prior history of stones Since that time he has had intermittent symptoms of frequency, urgency and dysuria with decreased force and caliber of his urinary stream His symptoms have progressively worsened. No bothersome lower urinary tract symptoms or prior history of BPH symptoms before this episode  PMH: Past Medical History:  Diagnosis Date   COPD (chronic obstructive pulmonary disease) (HCC)    GERD (gastroesophageal reflux disease)    Renal stones     Surgical History: Past Surgical History:  Procedure Laterality Date   HAND SURGERY Right     Home Medications:  Allergies as of 07/28/2024       Reactions   Penicillins Rash   Flexeril  [cyclobenzaprine ] Other (See Comments)   Sedation  Pt reports he does not believe this is an allergy.        Medication List        Accurate as of July 28, 2024 10:45 AM. If you have any questions, ask your nurse or doctor.          acetaminophen  325 MG tablet Commonly known as: TYLENOL  Take 325 mg by mouth 3 (three) times daily as needed.   albuterol  108 (90 Base) MCG/ACT inhaler Commonly known as: VENTOLIN  HFA USE 1 TO 2 INHALATIONS BY MOUTH  EVERY 6 HOURS AS NEEDED FOR  SHORTNESS OF BREATH OR WHEEZING   Breztri  Aerosphere 160-9-4.8 MCG/ACT Aero inhaler Generic drug: budesonide-glycopyrrolate-formoterol Inhale 2 puffs into the lungs in the morning and at bedtime.   cholecalciferol 25 MCG (1000 UNIT) tablet Commonly known as:  VITAMIN D3 Take 1,000 Units by mouth daily.   famotidine  20 MG tablet Commonly known as: PEPCID  Take 1 tablet (20 mg total) by mouth at bedtime.   fluticasone  50 MCG/ACT nasal spray Commonly known as: FLONASE  Place 2 sprays into both nostrils daily as needed for allergies or rhinitis.   gabapentin  100 MG capsule Commonly known as: NEURONTIN  Take 1-3 capsules (100-300 mg total) by mouth 2 (two) times daily.   HYDROcodone -acetaminophen  5-325 MG tablet Commonly known as: NORCO/VICODIN Take 1 tablet by mouth 3 (three) times daily as needed for moderate pain (pain score 4-6).   ibuprofen  200 MG tablet Commonly known as: ADVIL  Take 200 mg by mouth 3 (three) times daily as needed (with food.).   multivitamin tablet Take 1 tablet by mouth daily.   tamsulosin  0.4 MG Caps capsule Commonly known as: FLOMAX  Take 1 capsule (0.4 mg total) by mouth daily as needed.   tiZANidine  4 MG tablet Commonly known as: Zanaflex  Take 1/2 tablet at bedtime prn muscle spasm        Allergies:  Allergies  Allergen Reactions   Penicillins Rash   Flexeril  [Cyclobenzaprine ] Other (See Comments)    Sedation  Pt reports he does not believe this is an allergy.    Family History: Family History  Problem Relation Age of Onset   COPD Mother    COPD Father    Prostate cancer Paternal Uncle    Coronary  artery disease Brother 2       multiple stents   Colon cancer Neg Hx     Social History:  reports that he quit smoking about 15 years ago. His smoking use included cigarettes. He started smoking about 55 years ago. He has a 40 pack-year smoking history. He has never used smokeless tobacco. He reports that he does not drink alcohol and does not use drugs.   Physical Exam: BP 126/81   Pulse 70   Ht 5' 8 (1.727 m)   Wt 164 lb (74.4 kg)   SpO2 91%   BMI 24.94 kg/m   Constitutional:  Alert, No acute distress. HEENT: [place] AT Respiratory: Normal respiratory effort, no increased work of  breathing. Psychiatric: Normal mood and affect.  Laboratory Data:  Urinalysis Dipstick negative Microscopy negative   Pertinent Imaging: CT images were personally reviewed and interpreted.  On my review calculus measures 10 x 12 mm.  No significant prostate enlargement-volume calculated at 20 cc   Assessment & Plan:    1. Bladder calculus KUB ordered today to assess for interval change and stone measured 15 x 16 mm Voiding symptoms secondary to calculus Recommend cystolitholapaxy.  The procedure was discussed including potential risks of bleeding, infection and rarely bladder injury.  Most common postop side effects of storage related voiding symptoms were discussed Small prostate volume and would not recommend an outlet procedure with cystolitholapaxy All questions were answered and he desires to schedule   Roger JAYSON Barba, MD  [facility] [address] [place], [place] [address] [phone]

## 2024-07-28 NOTE — Progress Notes (Signed)
 07/28/2024 10:45 AM   [name] 10-12-1950 [id number]  Referring provider: Cleatus Arlyss RAMAN, MD [address] [place],  [place] [address]  Chief Complaint  Patient presents with   Nephrolithiasis    HPI: Roger Lewis is a 73 y.o. male referred for evaluation of a bladder calculus.  ED visit 04/11/2023 complaining of gross hematuria CT renal stone study was performed which showed a 10 mm bladder calculus.  There was a punctate, nonobstructing right renal calculus.  Does have a prior history of stones Since that time he has had intermittent symptoms of frequency, urgency and dysuria with decreased force and caliber of his urinary stream His symptoms have progressively worsened. No bothersome lower urinary tract symptoms or prior history of BPH symptoms before this episode  PMH: Past Medical History:  Diagnosis Date   COPD (chronic obstructive pulmonary disease) (HCC)    GERD (gastroesophageal reflux disease)    Renal stones     Surgical History: Past Surgical History:  Procedure Laterality Date   HAND SURGERY Right     Home Medications:  Allergies as of 07/28/2024       Reactions   Penicillins Rash   Flexeril  [cyclobenzaprine ] Other (See Comments)   Sedation  Pt reports he does not believe this is an allergy.        Medication List        Accurate as of July 28, 2024 10:45 AM. If you have any questions, ask your nurse or doctor.          acetaminophen  325 MG tablet Commonly known as: TYLENOL  Take 325 mg by mouth 3 (three) times daily as needed.   albuterol  108 (90 Base) MCG/ACT inhaler Commonly known as: VENTOLIN  HFA USE 1 TO 2 INHALATIONS BY MOUTH  EVERY 6 HOURS AS NEEDED FOR  SHORTNESS OF BREATH OR WHEEZING   Breztri  Aerosphere 160-9-4.8 MCG/ACT Aero inhaler Generic drug: budesonide-glycopyrrolate-formoterol Inhale 2 puffs into the lungs in the morning and at bedtime.   cholecalciferol 25 MCG (1000 UNIT) tablet Commonly known as:  VITAMIN D3 Take 1,000 Units by mouth daily.   famotidine  20 MG tablet Commonly known as: PEPCID  Take 1 tablet (20 mg total) by mouth at bedtime.   fluticasone  50 MCG/ACT nasal spray Commonly known as: FLONASE  Place 2 sprays into both nostrils daily as needed for allergies or rhinitis.   gabapentin  100 MG capsule Commonly known as: NEURONTIN  Take 1-3 capsules (100-300 mg total) by mouth 2 (two) times daily.   HYDROcodone -acetaminophen  5-325 MG tablet Commonly known as: NORCO/VICODIN Take 1 tablet by mouth 3 (three) times daily as needed for moderate pain (pain score 4-6).   ibuprofen  200 MG tablet Commonly known as: ADVIL  Take 200 mg by mouth 3 (three) times daily as needed (with food.).   multivitamin tablet Take 1 tablet by mouth daily.   tamsulosin  0.4 MG Caps capsule Commonly known as: FLOMAX  Take 1 capsule (0.4 mg total) by mouth daily as needed.   tiZANidine  4 MG tablet Commonly known as: Zanaflex  Take 1/2 tablet at bedtime prn muscle spasm        Allergies:  Allergies  Allergen Reactions   Penicillins Rash   Flexeril  [Cyclobenzaprine ] Other (See Comments)    Sedation  Pt reports he does not believe this is an allergy.    Family History: Family History  Problem Relation Age of Onset   COPD Mother    COPD Father    Prostate cancer Paternal Uncle    Coronary  artery disease Brother 2       multiple stents   Colon cancer Neg Hx     Social History:  reports that he quit smoking about 15 years ago. His smoking use included cigarettes. He started smoking about 55 years ago. He has a 40 pack-year smoking history. He has never used smokeless tobacco. He reports that he does not drink alcohol and does not use drugs.   Physical Exam: BP 126/81   Pulse 70   Ht 5' 8 (1.727 m)   Wt 164 lb (74.4 kg)   SpO2 91%   BMI 24.94 kg/m   Constitutional:  Alert, No acute distress. HEENT: [place] AT Respiratory: Normal respiratory effort, no increased work of  breathing. Psychiatric: Normal mood and affect.  Laboratory Data:  Urinalysis Dipstick negative Microscopy negative   Pertinent Imaging: CT images were personally reviewed and interpreted.  On my review calculus measures 10 x 12 mm.  No significant prostate enlargement-volume calculated at 20 cc   Assessment & Plan:    1. Bladder calculus KUB ordered today to assess for interval change and stone measured 15 x 16 mm Voiding symptoms secondary to calculus Recommend cystolitholapaxy.  The procedure was discussed including potential risks of bleeding, infection and rarely bladder injury.  Most common postop side effects of storage related voiding symptoms were discussed Small prostate volume and would not recommend an outlet procedure with cystolitholapaxy All questions were answered and he desires to schedule   Glendia JAYSON Barba, MD  [facility] [address] [place], [place] [address] [phone]

## 2024-07-29 LAB — URINALYSIS, COMPLETE
Bilirubin, UA: NEGATIVE
Glucose, UA: NEGATIVE
Ketones, UA: NEGATIVE
Leukocytes,UA: NEGATIVE
Nitrite, UA: NEGATIVE
Protein,UA: NEGATIVE
RBC, UA: NEGATIVE
Specific Gravity, UA: 1.03 (ref 1.005–1.030)
Urobilinogen, Ur: 0.2 mg/dL (ref 0.2–1.0)
pH, UA: 6 (ref 5.0–7.5)

## 2024-07-29 LAB — MICROSCOPIC EXAMINATION

## 2024-08-02 ENCOUNTER — Encounter: Payer: Self-pay | Admitting: Orthopedic Surgery

## 2024-08-02 DIAGNOSIS — S22069D Unspecified fracture of T7-T8 vertebra, subsequent encounter for fracture with routine healing: Secondary | ICD-10-CM

## 2024-08-02 NOTE — Telephone Encounter (Signed)
 Thoracic xrays dated 07/27/24:  FINDINGS:   BONES: Stable T7 and T8 compression fractures are again seen with slightly increased kyphosis. No new compression deformity is noted. No paraspinal mass or pedicle abnormality is noted. The visualized rib cage is within normal limits. Alignment is normal. No aggressive appearing osseous lesion.   DISCS AND DEGENERATIVE CHANGES: No severe degenerative changes.   SOFT TISSUES: The visualized lungs are clear.   IMPRESSION: 1. Stable T7 and T8 compression fractures with slightly increased kyphosis. 2. No new compression deformity.   Electronically signed by: Oneil Devonshire MD 07/31/2024 12:34 AM EST RP Workstation: [id number]   Thoracic MRI was ordered. I also recommend full length scoliosis xrays. Message sent to patient.

## 2024-08-03 ENCOUNTER — Other Ambulatory Visit: Payer: Self-pay

## 2024-08-03 DIAGNOSIS — N210 Calculus in bladder: Secondary | ICD-10-CM

## 2024-08-03 NOTE — Progress Notes (Signed)
 Surgical Physician Order Form [facility]  Dr. Glendia Barba, MD  * Scheduling expectation : Next Available  *Length of Case: 30 minutes  *Clearance needed: no  *Anticoagulation Instructions: N/A  *Aspirin Instructions: N/A  *Post-op visit Date/Instructions:  1 month follow up  *Diagnosis: Bladder Stone  *Procedure:  Cystolitholapaxy <2.5cm (47682)   Additional orders: N/A  -Admit type: OUTpatient  -Anesthesia: General  -VTE Prophylaxis Standing Order SCD's       Other:   -Standing Lab Orders Per Anesthesia    Lab other: UA&Urine Culture  -Standing Test orders EKG/Chest x-ray per Anesthesia       Test other:   - Medications:  Ancef 2gm IV  -Other orders:  N/A

## 2024-08-04 NOTE — Telephone Encounter (Signed)
 Received fax that patient was approved for patient assistance and Rx is needed at [facility].   Called to provide verbal order.   Pharmacy Phone # [facility]: [phone].    Pharmacy will contact patient to coordinate shipment.

## 2024-08-04 NOTE — Addendum Note (Signed)
 Addended by: Dnasia Gauna L on: 08/04/2024 08:51 AM   Modules accepted: Orders

## 2024-08-06 ENCOUNTER — Other Ambulatory Visit: Payer: Self-pay | Admitting: Family Medicine

## 2024-08-06 DIAGNOSIS — S22060D Wedge compression fracture of T7-T8 vertebra, subsequent encounter for fracture with routine healing: Secondary | ICD-10-CM

## 2024-08-09 ENCOUNTER — Ambulatory Visit
Admission: RE | Admit: 2024-08-09 | Discharge: 2024-08-09 | Disposition: A | Source: Ambulatory Visit | Attending: Orthopedic Surgery | Admitting: Orthopedic Surgery

## 2024-08-09 DIAGNOSIS — S22069D Unspecified fracture of T7-T8 vertebra, subsequent encounter for fracture with routine healing: Secondary | ICD-10-CM

## 2024-08-10 ENCOUNTER — Telehealth: Payer: Self-pay

## 2024-08-10 NOTE — Telephone Encounter (Signed)
 Per Dr. Twylla, Patient is to be scheduled for Cystolitholapaxy   [name] was contacted and possible surgical dates were discussed, Thursday December 18th, 2025 was agreed upon for surgery.   Patient was instructed that Dr. Twylla will require them to provide a pre-op UA & CX prior to surgery. This was ordered and scheduled drop off appointment was made for 08/16/2024.    Patient was directed to call [phone] between 1-3pm the day before surgery to find out surgical arrival time.  Instructions were given not to eat or drink from midnight on the night before surgery and have a driver for the day of surgery. On the surgery day patient was instructed to enter through the Medical Mall entrance of [facility] report the Same Day Surgery desk.   Pre-Admit Testing will be in contact via phone to set up an interview with the anesthesia team to review your history and medications prior to surgery.   Reminder of this information was sent via MyChart to the patient.

## 2024-08-10 NOTE — Progress Notes (Signed)
   [facility] Surgical Posting Form  Surgery Date: Date: 08/26/2024  Surgeon: Dr. Glendia Barba, MD  Inpt ( No  )   Outpt (Yes)   Obs ( No  )   Diagnosis: N21.0 Bladder Stone  -CPT: [phone]  Surgery: Cystolitholapaxy  Stop Anticoagulations: Yes and also hold ASA  Cardiac/Medical/Pulmonary Clearance needed: no  *Orders entered into EPIC  Date: 08/10/24   *Case booked in [place]  Date: 08/10/24  *Notified pt of Surgery: Date: 08/10/24  PRE-OP UA & CX: yes, will obtain in clinic on 08/16/2024  *Placed into Prior Authorization Work [place] Date: 08/10/24  Assistant/laser/rep:No

## 2024-08-15 ENCOUNTER — Telehealth: Payer: Self-pay | Admitting: Orthopedic Surgery

## 2024-08-15 DIAGNOSIS — S22069D Unspecified fracture of T7-T8 vertebra, subsequent encounter for fracture with routine healing: Secondary | ICD-10-CM

## 2024-08-15 NOTE — Telephone Encounter (Signed)
 Please call to let him know that he still needs to get full length scoliosis xrays to look further into his alignment.   He will need to get these at [facility].   Once I have these results, we will review them along with his MRI results.

## 2024-08-16 ENCOUNTER — Other Ambulatory Visit

## 2024-08-16 ENCOUNTER — Ambulatory Visit
Admission: RE | Admit: 2024-08-16 | Discharge: 2024-08-16 | Disposition: A | Source: Ambulatory Visit | Attending: Orthopedic Surgery | Admitting: Orthopedic Surgery

## 2024-08-16 ENCOUNTER — Ambulatory Visit
Admission: RE | Admit: 2024-08-16 | Discharge: 2024-08-16 | Disposition: A | Attending: Orthopedic Surgery | Admitting: Orthopedic Surgery

## 2024-08-16 DIAGNOSIS — S22069D Unspecified fracture of T7-T8 vertebra, subsequent encounter for fracture with routine healing: Secondary | ICD-10-CM

## 2024-08-16 DIAGNOSIS — N210 Calculus in bladder: Secondary | ICD-10-CM

## 2024-08-16 NOTE — Telephone Encounter (Signed)
 Patient advised and he will try to get this done today

## 2024-08-16 NOTE — Telephone Encounter (Signed)
Left message to call back and sent mychart message  

## 2024-08-16 NOTE — Telephone Encounter (Signed)
 Noted

## 2024-08-17 ENCOUNTER — Other Ambulatory Visit: Payer: Self-pay

## 2024-08-17 ENCOUNTER — Inpatient Hospital Stay: Admission: RE | Admit: 2024-08-17 | Discharge: 2024-08-17 | Attending: Urology | Admitting: Urology

## 2024-08-17 VITALS — Ht 68.0 in | Wt 158.0 lb

## 2024-08-17 DIAGNOSIS — I2510 Atherosclerotic heart disease of native coronary artery without angina pectoris: Secondary | ICD-10-CM

## 2024-08-17 LAB — MICROSCOPIC EXAMINATION: Bacteria, UA: NONE SEEN

## 2024-08-17 LAB — URINALYSIS, COMPLETE
Bilirubin, UA: NEGATIVE
Glucose, UA: NEGATIVE
Ketones, UA: NEGATIVE
Leukocytes,UA: NEGATIVE
Nitrite, UA: NEGATIVE
Protein,UA: NEGATIVE
RBC, UA: NEGATIVE
Specific Gravity, UA: 1.02 (ref 1.005–1.030)
Urobilinogen, Ur: 0.2 mg/dL (ref 0.2–1.0)
pH, UA: 6 (ref 5.0–7.5)

## 2024-08-17 NOTE — Patient Instructions (Signed)
 Your procedure is scheduled on: Thursday 08/26/24 Report to the Registration Desk on the 1st floor of the Medical Mall. To find out your arrival time, please call [phone] between 1PM - 3PM on: Wednesday 08/25/24 If your arrival time is 6:00 am, do not arrive before that time as the Medical Mall entrance doors do not open until 6:00 am.  REMEMBER: Instructions that are not followed completely may result in serious medical risk, up to and including death; or upon the discretion of your surgeon and anesthesiologist your surgery may need to be rescheduled.  Do not eat food or drink any liquids after midnight the night before surgery.  No gum chewing or hard candies.  One week prior to surgery: Stop Anti-inflammatories (NSAIDS) such as Advil , Aleve, Ibuprofen , Motrin , Naproxen, Naprosyn and Aspirin based products such as Excedrin, Goody's Powder, BC Powder.  You may however, continue to take Tylenol  if needed for pain up until the day of surgery.  Stop ANY OVER THE COUNTER supplements and vitamins for 1 week until after surgery.  Continue taking all of your other prescription medications up until the day of surgery.  ON THE DAY OF SURGERY ONLY TAKE THESE MEDICATIONS WITH SIPS OF WATER:  none  Use your nebulizer and Breztri  on the day of surgery and bring your Albuterol  to the hospital.  No Alcohol for 24 hours before or after surgery.  No Smoking including e-cigarettes for 24 hours before surgery.  No chewable tobacco products for at least 6 hours before surgery.  No nicotine patches on the day of surgery.  Do not use any recreational drugs for at least a week (preferably 2 weeks) before your surgery.  Please be advised that the combination of cocaine and anesthesia may have negative outcomes, up to and including death. If you test positive for cocaine, your surgery will be cancelled.  On the morning of surgery brush your teeth with toothpaste and water, you may rinse your  mouth with mouthwash if you wish. Do not swallow any toothpaste or mouthwash.  Use CHG Soap or wipes as directed on instruction sheet. Shower with your regular soap prior to arrival.  Do not wear jewelry, make-up, hairpins, clips or nail polish.  For welded (permanent) jewelry: bracelets, anklets, waist bands, etc.  Please have this removed prior to surgery.  If it is not removed, there is a chance that hospital personnel will need to cut it off on the day of surgery.  Do not wear lotions, powders, or perfumes.   Do not shave body hair from the neck down 48 hours before surgery.  Contact lenses, hearing aids and dentures may not be worn into surgery.  Do not bring valuables to the hospital. [facility] is not responsible for any missing/lost belongings or valuables.   Notify your doctor if there is any change in your medical condition (cold, fever, infection).  Wear comfortable clothing (specific to your surgery type) to the hospital.  After surgery, you can help prevent lung complications by doing breathing exercises.  Take deep breaths and cough every 1-2 hours. Your doctor may order a device called an Incentive Spirometer to help you take deep breaths.  If you are being discharged the day of surgery, you will not be allowed to drive home. You will need a responsible individual to drive you home and stay with you for 24 hours after surgery.   Please call the Pre-admissions Testing Dept. at [phone] if you have any questions about these instructions.  Surgery Visitation Policy:  Patients having surgery or a procedure may have two visitors.  Children under the age of 34 must have an adult with them who is not the patient.  Merchandiser, Retail to address health-related social needs:  [email or web address]

## 2024-08-18 ENCOUNTER — Inpatient Hospital Stay: Admission: RE | Admit: 2024-08-18 | Discharge: 2024-08-18 | Attending: Urology | Admitting: Urology

## 2024-08-18 DIAGNOSIS — I2510 Atherosclerotic heart disease of native coronary artery without angina pectoris: Secondary | ICD-10-CM | POA: Insufficient documentation

## 2024-08-18 DIAGNOSIS — Z01818 Encounter for other preprocedural examination: Secondary | ICD-10-CM | POA: Insufficient documentation

## 2024-08-18 LAB — CBC
HCT: 37.7 % — ABNORMAL LOW (ref 39.0–52.0)
Hemoglobin: 12.4 g/dL — ABNORMAL LOW (ref 13.0–17.0)
MCH: 29.3 pg (ref 26.0–34.0)
MCHC: 32.9 g/dL (ref 30.0–36.0)
MCV: 89.1 fL (ref 80.0–100.0)
Platelets: 261 K/uL (ref 150–400)
RBC: 4.23 MIL/uL (ref 4.22–5.81)
RDW: 12.8 % (ref 11.5–15.5)
WBC: 5.8 K/uL (ref 4.0–10.5)
nRBC: 0 % (ref 0.0–0.2)

## 2024-08-20 ENCOUNTER — Other Ambulatory Visit: Payer: Self-pay | Admitting: Family Medicine

## 2024-08-20 DIAGNOSIS — S22060D Wedge compression fracture of T7-T8 vertebra, subsequent encounter for fracture with routine healing: Secondary | ICD-10-CM

## 2024-08-20 LAB — CULTURE, URINE COMPREHENSIVE

## 2024-08-20 NOTE — Telephone Encounter (Signed)
 Last OV: 06/28/24 Pending OV: Nothing scheduled Medication: Hydrocodone  5-325 Directions: Take one tablet TID Last Refill: 08/09/24 Qty: #30 with 0 refills

## 2024-08-22 NOTE — Telephone Encounter (Signed)
 Sent.  Please get update on patient re: plan for pain via neurosurgery.   Thanks.

## 2024-08-25 MED ORDER — LACTATED RINGERS IV SOLN: INTRAVENOUS | Status: DC

## 2024-08-25 MED ORDER — ORAL CARE MOUTH RINSE: 15.0000 mL | Freq: Once | OROMUCOSAL | Status: AC

## 2024-08-25 MED ORDER — CHLORHEXIDINE GLUCONATE 0.12 % MT SOLN
15.0000 mL | Freq: Once | OROMUCOSAL | Status: AC
  Administered 2024-08-26: 10:00:00 15 mL via OROMUCOSAL

## 2024-08-26 ENCOUNTER — Encounter: Payer: Self-pay | Admitting: Urology

## 2024-08-26 ENCOUNTER — Other Ambulatory Visit: Payer: Self-pay

## 2024-08-26 ENCOUNTER — Encounter: Admission: RE | Disposition: A | Payer: Self-pay | Attending: Urology

## 2024-08-26 ENCOUNTER — Ambulatory Visit: Admitting: Certified Registered"

## 2024-08-26 ENCOUNTER — Ambulatory Visit
Admission: RE | Admit: 2024-08-26 | Discharge: 2024-08-26 | Disposition: A | Discharge status: Home / Self Care | Attending: Urology | Admitting: Urology

## 2024-08-26 DIAGNOSIS — J449 Chronic obstructive pulmonary disease, unspecified: Secondary | ICD-10-CM | POA: Insufficient documentation

## 2024-08-26 DIAGNOSIS — K219 Gastro-esophageal reflux disease without esophagitis: Secondary | ICD-10-CM | POA: Insufficient documentation

## 2024-08-26 DIAGNOSIS — Z87891 Personal history of nicotine dependence: Secondary | ICD-10-CM | POA: Insufficient documentation

## 2024-08-26 DIAGNOSIS — N210 Calculus in bladder: Secondary | ICD-10-CM | POA: Insufficient documentation

## 2024-08-26 HISTORY — PX: CYSTOSCOPY WITH LITHOLAPAXY: SHX1425

## 2024-08-26 SURGERY — CYSTOSCOPY, WITH BLADDER CALCULUS LITHOLAPAXY
Anesthesia: General | Site: Bladder

## 2024-08-26 MED ORDER — ONDANSETRON HCL 4 MG/2ML IJ SOLN
INTRAMUSCULAR | Status: DC | PRN
  Administered 2024-08-26 (×2): 4 mg via INTRAVENOUS

## 2024-08-26 MED ORDER — STERILE WATER FOR IRRIGATION IR SOLN
Status: DC | PRN
  Administered 2024-08-26: 12:00:00 500 mL

## 2024-08-26 MED ORDER — VANCOMYCIN HCL 1250 MG/250ML IV SOLN
1250.0000 mg | INTRAVENOUS | Status: AC
  Administered 2024-08-26: 11:00:00 1250 mg via INTRAVENOUS
  Filled 2024-08-26: qty 250

## 2024-08-26 MED ORDER — OXYCODONE HCL 5 MG PO TABS
ORAL_TABLET | ORAL | Status: AC
  Filled 2024-08-26: qty 1

## 2024-08-26 MED ORDER — OXYCODONE HCL 5 MG/5ML PO SOLN: 5.0000 mg | Freq: Once | ORAL | Status: AC | PRN

## 2024-08-26 MED ORDER — OXYCODONE HCL 5 MG PO TABS
5.0000 mg | ORAL_TABLET | Freq: Once | ORAL | Status: AC | PRN
  Administered 2024-08-26: 13:00:00 5 mg via ORAL

## 2024-08-26 MED ORDER — PHENYLEPHRINE 80 MCG/ML (10ML) SYRINGE FOR IV PUSH (FOR BLOOD PRESSURE SUPPORT)
PREFILLED_SYRINGE | INTRAVENOUS | Status: DC | PRN
  Administered 2024-08-26: 12:00:00 80 ug via INTRAVENOUS
  Administered 2024-08-26: 12:00:00 160 ug via INTRAVENOUS

## 2024-08-26 MED ORDER — PHENAZOPYRIDINE HCL 200 MG PO TABS
200.0000 mg | ORAL_TABLET | Freq: Three times a day (TID) | ORAL | 1 refills | Status: DC | PRN
  Filled 2024-08-26: qty 10, 4d supply, fill #0
  Filled 2024-09-04: qty 10, 4d supply, fill #1

## 2024-08-26 MED ORDER — DEXAMETHASONE SOD PHOSPHATE PF 10 MG/ML IJ SOLN
INTRAMUSCULAR | Status: DC | PRN
  Administered 2024-08-26: 11:00:00 10 mg via INTRAVENOUS

## 2024-08-26 MED ORDER — ESMOLOL HCL 100 MG/10ML IV SOLN
INTRAVENOUS | Status: DC | PRN
  Administered 2024-08-26: 12:00:00 10 mg via INTRAVENOUS

## 2024-08-26 MED ORDER — CEFAZOLIN SODIUM-DEXTROSE 2-4 GM/100ML-% IV SOLN
INTRAVENOUS | Status: AC
  Filled 2024-08-26: qty 100

## 2024-08-26 MED ORDER — ACETAMINOPHEN 10 MG/ML IV SOLN
INTRAVENOUS | Status: DC | PRN
  Administered 2024-08-26: 12:00:00 1000 mg via INTRAVENOUS

## 2024-08-26 MED ORDER — ROCURONIUM BROMIDE 100 MG/10ML IV SOLN
INTRAVENOUS | Status: DC | PRN
  Administered 2024-08-26: 11:00:00 40 mg via INTRAVENOUS
  Administered 2024-08-26: 11:00:00 10 mg via INTRAVENOUS

## 2024-08-26 MED ORDER — FENTANYL CITRATE (PF) 100 MCG/2ML IJ SOLN
INTRAMUSCULAR | Status: AC
  Filled 2024-08-26: qty 2

## 2024-08-26 MED ORDER — LIDOCAINE HCL (CARDIAC) PF 100 MG/5ML IV SOSY
PREFILLED_SYRINGE | INTRAVENOUS | Status: DC | PRN
  Administered 2024-08-26: 11:00:00 100 mg via INTRAVENOUS

## 2024-08-26 MED ORDER — FENTANYL CITRATE (PF) 100 MCG/2ML IJ SOLN: 25.0000 ug | INTRAMUSCULAR | Status: DC | PRN

## 2024-08-26 MED ORDER — SUCCINYLCHOLINE CHLORIDE 200 MG/10ML IV SOSY
PREFILLED_SYRINGE | INTRAVENOUS | Status: DC | PRN
  Administered 2024-08-26: 11:00:00 100 mg via INTRAVENOUS

## 2024-08-26 MED ORDER — ACETAMINOPHEN 10 MG/ML IV SOLN
INTRAVENOUS | Status: AC
  Filled 2024-08-26: qty 100

## 2024-08-26 MED ORDER — CHLORHEXIDINE GLUCONATE 0.12 % MT SOLN
OROMUCOSAL | Status: AC
  Filled 2024-08-26: qty 15

## 2024-08-26 MED ORDER — GLYCOPYRROLATE 0.2 MG/ML IJ SOLN
INTRAMUSCULAR | Status: DC | PRN
  Administered 2024-08-26: 11:00:00 .2 mg via INTRAVENOUS

## 2024-08-26 MED ORDER — FENTANYL CITRATE (PF) 100 MCG/2ML IJ SOLN
INTRAMUSCULAR | Status: DC | PRN
  Administered 2024-08-26 (×2): 50 ug via INTRAVENOUS

## 2024-08-26 MED ORDER — PROPOFOL 10 MG/ML IV BOLUS
INTRAVENOUS | Status: DC | PRN
  Administered 2024-08-26: 11:00:00 150 mg via INTRAVENOUS

## 2024-08-26 MED ADMIN — Sugammadex Sodium IV 200 MG/2ML (Base Equivalent): 200 mg | INTRAVENOUS | @ 12:00:00 | NDC 99999070036

## 2024-08-26 MED ADMIN — Sodium Chloride Irrigation Soln 0.9%: 6000 mL | @ 12:00:00 | NDC 00338004724

## 2024-08-26 SURGICAL SUPPLY — 19 items
BAG DRAIN SIEMENS DORNER NS (MISCELLANEOUS) ×1 IMPLANT
BAG URINE DRAIN 2000ML AR STRL (UROLOGICAL SUPPLIES) IMPLANT
BASKET ZERO TIP 1.9FR (BASKET) IMPLANT
CATH FOL 2WAY LX 18X30 (CATHETERS) IMPLANT
CATH URETL OPEN END 4X70 (CATHETERS) IMPLANT
FIBER LASER MOSES 200 DFL (Laser) IMPLANT
FIBER LASER MOSES 365 DFL (Laser) IMPLANT
GLOVE BIOGEL PI IND STRL 7.5 (GLOVE) ×1 IMPLANT
GOWN STRL REUS W/ TWL LRG LVL3 (GOWN DISPOSABLE) ×2 IMPLANT
GOWN STRL REUS W/ TWL XL LVL3 (GOWN DISPOSABLE) ×1 IMPLANT
KIT TURNOVER CYSTO (KITS) ×1 IMPLANT
PACK CYSTO AR (MISCELLANEOUS) ×1 IMPLANT
SET IRRIG Y-TYPE CYSTO (SET/KITS/TRAYS/PACK) ×1 IMPLANT
SOL .9 NS 3000ML IRR UROMATIC (IV SOLUTION) ×2 IMPLANT
SOLN STERILE WATER 500 ML (IV SOLUTION) IMPLANT
SOLN STERILE WATER BTL 1000 ML (IV SOLUTION) ×1 IMPLANT
SURGILUBE 2OZ TUBE FLIPTOP (MISCELLANEOUS) ×1 IMPLANT
SYRINGE TOOMEY IRRIG 70ML (MISCELLANEOUS) ×1 IMPLANT
WATER STERILE IRR 3000ML UROMA (IV SOLUTION) IMPLANT

## 2024-08-26 NOTE — Op Note (Signed)
" ° °  Preoperative diagnosis:  Bladder calculus (< 2.5 cm)  Postoperative diagnosis:  Same  Procedure: Cystolitholapaxy  Surgeon: Glendia JAYSON Barba, MD  Anesthesia: General  Complications: None  Intraoperative findings:  Cystoscopy: Urethra normal in caliber mild stricture.  Mild lateral lobe enlargement distally with open bladder neck.  Calculus noted bladder base.  Bladder mucosa without erythema, solid or papillary lesions  EBL: Minimal  Specimens: None  Indication: Roger Lewis is a 73 y.o. male with a 15 mm bladder calculus and voiding symptoms secondary to the stone.  CT showed no significant prostate enlargement and no bothersome baseline voiding symptoms.  He declined an outlet procedure.  After reviewing the management options for treatment, he elected to proceed with the above surgical procedure(s). We have discussed the potential benefits and risks of the procedure, side effects of the proposed treatment, the likelihood of the patient achieving the goals of the procedure, and any potential problems that might occur during the procedure or recuperation. Informed consent has been obtained.  Description of procedure:  The patient was taken to the operating room and general anesthesia was induced.  The patient was placed in the dorsal lithotomy position, prepped and draped in the usual sterile fashion, and preoperative antibiotics were administered. A preoperative time-out was performed.   A 21 French cystoscope was lubricated, placed per urethra and advanced proximally into the bladder under direct vision.  Panendoscopy was then performed.  Findings as described above.  The cystoscope was removed and replaced with a 56F continuous-flow resectoscope sheath with obturator.  The obturator was replaced with a laser bridge.  A 54F ureteral catheter was placed through the laser bridge and cut short.  A 365 m Moses holmium laser fiber was then placed through the ureteral  catheter.  The calculus was then dusted at a setting of 0.5 J/100 Hz.  Several larger fragments chipped off the stone during dusting which were further treated with noncontact laser lithotripsy.  Once no significant stone fragments were identified the resectoscope sheath was irrigated with removal of fine stone particles.  Repeat cystoscopy showed no remaining fragments.  Mild oozing noted at the bladder neck which was treated with the holmium laser at a setting of 1.5 J/30 Hz  The resectoscope was then removed.  An 8F Foley catheter was placed to gravity drainage.  Catheter was irrigated with return of clear effluent.  After anesthetic reversal he was transported to the PACU in stable condition.  Plan: Catheter will be removed prior to discharge if urine clear-pink. 1 month postop follow-up to be scheduled   Glendia JAYSON Barba, M.D.  "

## 2024-08-26 NOTE — Progress Notes (Signed)
 Pharmacy Antibiotic Note  Roger Lewis is a 73 y.o. male admitted on (Not on file) with scheduled cystoscopy.  Pharmacy has been consulted for Vancomycin  dosing.  Plan: Vancomycin  1250 mg (15 mg/kg) IV X 1 60 min pre-op ordered for 12/18 @ 0600.      No data recorded.  No results for input(s): WBC, CREATININE, LATICACIDVEN, VANCOTROUGH, VANCOPEAK, VANCORANDOM, GENTTROUGH, GENTPEAK, GENTRANDOM, TOBRATROUGH, TOBRAPEAK, TOBRARND, AMIKACINPEAK, AMIKACINTROU, AMIKACIN in the last 168 hours.  CrCl cannot be calculated (Patient's most recent lab result is older than the maximum 21 days allowed.).    Allergies[1]  Antimicrobials this admission:   >>    >>   Dose adjustments this admission:   Microbiology results:  BCx:   UCx:    Sputum:    MRSA PCR:   Thank you for allowing pharmacy to be a part of this patients care.  Roger Lewis D 08/26/2024 1:42 AM     [1]  Allergies Allergen Reactions   Penicillins Rash   Flexeril  [Cyclobenzaprine ] Other (See Comments)    Sedation  Pt reports he does not believe this is an allergy.

## 2024-08-26 NOTE — Anesthesia Procedure Notes (Signed)
 Procedure Name: Intubation Date/Time: 08/26/2024 11:24 AM  Performed by: Ledora Duncan, CRNAPre-anesthesia Checklist: Patient identified, Emergency Drugs available, Suction available and Patient being monitored Patient Re-evaluated:Patient Re-evaluated prior to induction Oxygen Delivery Method: Circle system utilized Preoxygenation: Pre-oxygenation with 100% oxygen Induction Type: IV induction Ventilation: Mask ventilation without difficulty Laryngoscope Size: McGrath and 3 Grade View: Grade I Tube type: Oral Tube size: 7.0 mm Number of attempts: 1 Airway Equipment and Method: Stylet Placement Confirmation: ETT inserted through vocal cords under direct vision, positive ETCO2 and breath sounds checked- equal and bilateral Secured at: 21 cm Tube secured with: Tape Dental Injury: Teeth and Oropharynx as per pre-operative assessment

## 2024-08-26 NOTE — Anesthesia Preprocedure Evaluation (Signed)
 Anesthesia Evaluation  Patient identified by MRN, date of birth, ID band Patient awake    Reviewed: Allergy & Precautions, NPO status , Patient's Chart, lab work & pertinent test results  Airway Mallampati: III  TM Distance: >3 FB Neck ROM: full    Dental  (+) Dental Advidsory Given, Missing   Pulmonary COPD,  COPD inhaler, former smoker   Pulmonary exam normal        Cardiovascular negative cardio ROS Normal cardiovascular exam     Neuro/Psych negative neurological ROS  negative psych ROS   GI/Hepatic Neg liver ROS,GERD  Medicated and Controlled,,  Endo/Other  negative endocrine ROS    Renal/GU      Musculoskeletal   Abdominal   Peds  Hematology negative hematology ROS (+)   Anesthesia Other Findings Past Medical History: No date: COPD (chronic obstructive pulmonary disease) (HCC) No date: GERD (gastroesophageal reflux disease) No date: Renal stones  Past Surgical History: 1997: HAND SURGERY; Right  BMI    Body Mass Index: 24.02 kg/m      Reproductive/Obstetrics negative OB ROS                              Anesthesia Physical Anesthesia Plan  ASA: 3  Anesthesia Plan: General   Post-op Pain Management:    Induction: Intravenous  PONV Risk Score and Plan: 2 and Ondansetron  and Dexamethasone   Airway Management Planned: LMA  Additional Equipment:   Intra-op Plan:   Post-operative Plan: Extubation in OR  Informed Consent: I have reviewed the patients History and Physical, chart, labs and discussed the procedure including the risks, benefits and alternatives for the proposed anesthesia with the patient or authorized representative who has indicated his/her understanding and acceptance.     Dental Advisory Given  Plan Discussed with: Anesthesiologist, CRNA and Surgeon  Anesthesia Plan Comments: (Patient consented for risks of anesthesia including but not limited  to:  - adverse reactions to medications - damage to eyes, teeth, lips or other oral mucosa - nerve damage due to positioning  - sore throat or hoarseness - Damage to heart, brain, nerves, lungs, other parts of body or loss of life  Patient voiced understanding and assent.)        Anesthesia Quick Evaluation

## 2024-08-26 NOTE — Interval H&P Note (Signed)
 History and Physical Interval Note:  08/26/2024 11:12 AM  Roger Lewis  has presented today for surgery, with the diagnosis of Bladder Stone.  The various methods of treatment have been discussed with the patient and family. After consideration of risks, benefits and other options for treatment, the patient has consented to  Procedures: CYSTOSCOPY, WITH BLADDER CALCULUS LITHOLAPAXY (N/A) as a surgical intervention.  The patient's history has been reviewed, patient examined, no change in status, stable for surgery.  I have reviewed the patient's chart and labs.  Questions were answered to the patient's satisfaction.    CV:RRR Lungs:clear  Roger Lewis

## 2024-08-26 NOTE — Transfer of Care (Signed)
 Immediate Anesthesia Transfer of Care Note  Patient: Roger Lewis  Procedure(s) Performed: CYSTOSCOPY, WITH BLADDER CALCULUS LITHOLAPAXY (Bladder)  Patient Location: PACU  Anesthesia Type:General  Level of Consciousness: drowsy and patient cooperative  Airway & Oxygen Therapy: Patient Spontanous Breathing and Patient connected to face mask oxygen  Post-op Assessment: Report given to RN and Post -op Vital signs reviewed and stable  Post vital signs: Reviewed and stable  Last Vitals:  Vitals Value Taken Time  BP 120/84 08/26/24 12:03  Temp    Pulse 94 08/26/24 12:05  Resp 17 08/26/24 12:05  SpO2 100 % 08/26/24 12:05  Vitals shown include unfiled device data.  Last Pain:  Vitals:   08/26/24 0952  TempSrc: Temporal  PainSc: 4          Complications: No notable events documented.

## 2024-08-26 NOTE — Discharge Instructions (Signed)
 Bladder stone instructions  You may pass blood clots in your urine. It is not unusual to pass small blood clots and have some bloody urine a a week after your procedure. Again, call your doctor if the bleeding does not subside. You may have: Dysuria (painful urination) Frequency (urinating often) Urgency (strong desire to urinate)  These symptoms are common.  Avoiding alcohol and caffeine, such as coffee, tea, and chocolate, may help relieve these symptoms. Drink plenty of water , unless otherwise instructed.  A prescription for Pyridium  which will help burning with urination was sent to your pharmacy.  Special Instructions:   You may resume your normal activities in 24 hours.  You will be scheduled for a follow-up appointment in approximately 1 month  Call Your Doctor If: You have severe pain You are unable to urinate You have a fever over 101 You have severe bleeding

## 2024-08-27 ENCOUNTER — Encounter: Payer: Self-pay | Admitting: Urology

## 2024-08-28 NOTE — Anesthesia Postprocedure Evaluation (Signed)
"   Anesthesia Post Note  Patient: Roger Lewis  Procedure(s) Performed: CYSTOSCOPY, WITH BLADDER CALCULUS LITHOLAPAXY (Bladder)  Patient location during evaluation: PACU Anesthesia Type: General Level of consciousness: awake and alert Pain management: pain level controlled Vital Signs Assessment: post-procedure vital signs reviewed and stable Respiratory status: spontaneous breathing, nonlabored ventilation, respiratory function stable and patient connected to nasal cannula oxygen Cardiovascular status: blood pressure returned to baseline and stable Postop Assessment: no apparent nausea or vomiting Anesthetic complications: no   No notable events documented.   Last Vitals:  Vitals:   08/26/24 1245 08/26/24 1312  BP: 128/75 120/69  Pulse: 82 87  Resp: 16 16  Temp:  36.5 C  SpO2: 93% 91%    Last Pain:  Vitals:   08/26/24 1312  TempSrc:   PainSc: 3                  Prentice Murphy      "

## 2024-08-29 NOTE — Progress Notes (Unsigned)
 "  Telephone Visit- Progress Note: Referring Physician:  Cleatus Arlyss RAMAN, MD [address] [place],  [place] [address]  Primary Physician:  Cleatus Arlyss RAMAN, MD  This visit was performed via telephone.  Patient location: home Provider location: office  I spent a total of 15 minutes non-face-to-face activities for this visit on the date of this encounter including review of current clinical condition and response to treatment.    Patient has given verbal consent to this telephone visits and we reviewed the limitations of a telephone visit. Patient wishes to proceed.    Chief Complaint:  review imaging  History of Present Illness: Roger Lewis is a 73 y.o. male has a history of   COPD, GERD.    He has known history of  T8 compression fracture that likely occurred on 10/03/23 when he was lifting hay bales. He has known chronic vertebral height loss of T7 and T9.   Last seen by me on 07/27/24 with persistent mid back pain with no arm/leg pain.   Phone visit scheduled to review his thoracic MRI and scoliosis xrays. He had surgery for bladder stone last week.   He has been doing a little better since his last visit. He has modified his activity and he thinks this is helping. He now has intermittent mid back pain that is worse with doing his chores and with laying on his right side. No arm or leg pain. No numbness, tingling, or weakness. Some relief with changing position and medications.    He has not started neurontin  yet. He takes prn norco and zanaflex .    He quit smoking in 2010.    The symptoms are causing a significant impact on the patient's life.     Exam: No exam done as this was a telephone encounter.     Imaging: Scoliosis xrays dated 08/16/24:  FINDINGS:   BONES: Thoracic kypholevoscoliosis is present, with a kyphotic angle measuring 51 degrees centered at T7 and a levocurvature measuring 9 degrees from the T5 upper plate to the inferior endplate of T11. The  scoliosis is difficult to measure due to diffuse advanced bone density loss.   Chronic moderate to severe compression fractures of the T7 and T8 vertebral bodies are noted, with no new, further, or progressive compression fractures being seen.   There is no AP listhesis. In the lumbar spine, there is a slight dextrorotary scoliosis of less than 5 degrees.   The lumbar vertebrae are normal in height. There is no anterolisthesis. The visualized pelvis and hips are intact.   DISCS AND DEGENERATIVE CHANGES: Mild spondylosis and facet hypertrophy are present.   The thoracic discs are degenerated from T5-T6 down, especially at T6-T7 and T7-T8. In the lumbar spine, there is moderate disc space loss at L3-L4 and L4-L5. Mild lumbar spondylosis is noted. There is mild facet joint spurring.   SOFT TISSUES: In the chest, visualized portions of the lungs are emphysematous but clear, allowing for bone technique. There is calcification and tortuosity of the thoracic aorta.   In the abdomen, the bowel pattern is nonobstructive.   A 1 x 2 cm bladder stone is again noted superimposing just above the symphysis.   The abdominal aorta is heavily calcified. No visible nephrolithiasis.   IMPRESSION: 1. Thoracic kypholevoscoliosis with a kyphotic angle of 51 degrees centered at T7 and levocurvature of 9 degrees from T5 to T11, with scoliosis measurement limited by advanced bone density loss. 2. Chronic moderate to severe compression fractures  of the T7 and T8 vertebral bodies without new or progressive compression fractures. 3. Mild thoracic spondylosis and facet hypertrophy with multilevel thoracic disc degeneration, most notable at T6-T7 and T7-T8. 4. Slight dextrorotary scoliosis of the lumbar spine measuring less than 5 degrees with moderate disc space loss at L3-L4 and L4-L5, mild lumbar spondylosis, and mild facet spurring, without anterolisthesis. 5. Emphysematous changes in the visualized  lungs. 6. Calcified and tortuous thoracic aorta with heavy calcification of the abdominal aorta. 7. Re-demonstrated 1 x 2 cm bladder stone, with no visible nephrolithiasis.   Electronically signed by: Francis Quam MD 08/21/2024 11:37 PM EST RP Workstation: [id number]   Thoracic MRI dated 08/09/24:  FINDINGS:   BONES AND ALIGNMENT: Normal alignment. T7 and T8 compression fractures are stable and without edema. Bone marrow signal is unremarkable. No abnormal enhancement.   SPINAL CORD: Normal spinal cord volume. Normal spinal cord signal.   SOFT TISSUES: Unremarkable.   DEGENERATIVE CHANGES: Broad based disc protrusion at T7-T8 contacts the ventral surface of the cord without abnormality signal or distortion. No spinal canal stenosis or neural foraminal narrowing.   IMPRESSION: 1. Stable T7 and T8 compression fractures without edema. 2. Broad-based T7-T8 disc protrusion contacting the ventral cord without cord signal abnormality or distortion.   Electronically signed by: Lonni Necessary MD 08/14/2024 11:04 AM EST RP Workstation: [id number]     I have personally reviewed the images and agree with the above interpretation.  Assessment and Plan: Roger Lewis has been doing a little better since his last visit. He has modified his activity and he thinks this is helping.   He has intermittent mid back pain that is worse with doing his chores and with laying on his right side. No arm or leg pain. No numbness, tingling, or weakness.   He has known compression fracture T7 and T8 with no new compression fractures. He has significant kyphosis above his fractures.   Pain likely from increased kyphosis.    Treatment options discussed with patient and following plan made:   - Scoliosis xrays reviewed with Dr. Clois prior to his visit and he does not recommend surgery to correct his kyphosis due to his age and comorbidities.  - Discussed PT for thoracic spine and he declines.   - Recommend pain management for further medical  management of pain. He will discuss with PCP and let me know if he needs a referral.  - He will follow up prn at his request.   [name] PA-C Neurosurgery "

## 2024-08-31 ENCOUNTER — Ambulatory Visit (INDEPENDENT_AMBULATORY_CARE_PROVIDER_SITE_OTHER): Admitting: Orthopedic Surgery

## 2024-08-31 ENCOUNTER — Encounter: Payer: Self-pay | Admitting: Orthopedic Surgery

## 2024-08-31 DIAGNOSIS — S22069D Unspecified fracture of T7-T8 vertebra, subsequent encounter for fracture with routine healing: Secondary | ICD-10-CM

## 2024-08-31 DIAGNOSIS — M40209 Unspecified kyphosis, site unspecified: Secondary | ICD-10-CM | POA: Diagnosis not present

## 2024-09-03 ENCOUNTER — Other Ambulatory Visit: Payer: Self-pay | Admitting: Family Medicine

## 2024-09-03 DIAGNOSIS — S22060D Wedge compression fracture of T7-T8 vertebra, subsequent encounter for fracture with routine healing: Secondary | ICD-10-CM

## 2024-09-04 ENCOUNTER — Other Ambulatory Visit: Payer: Self-pay

## 2024-09-08 NOTE — Telephone Encounter (Signed)
 Received approval letter from [employer]  thru 09/08/2025 approval letter index.

## 2024-09-10 NOTE — Telephone Encounter (Signed)
 Copied from CRM [id number]. Topic: Clinical - Medical Advice >> Sep 10, 2024 11:24 AM Drema MATSU wrote: Reason for CRM: Patient is calling to check on Hydrocodone  rx. He is completely out.

## 2024-09-10 NOTE — Telephone Encounter (Signed)
 Plz notify pt will refill Rx for compression fracture, rib fractures but will need PCP f/u for next steps.  Per latest neurosurgery note recommended pain management referral.  Will cc PCP.

## 2024-09-12 NOTE — Telephone Encounter (Signed)
 Agree.  Thanks.  ================== Please call pt. Neurosurgery advised pain clinic referral.  I think this is a good option.  I went ahead and put in the referral.  Thanks.

## 2024-09-12 NOTE — Addendum Note (Signed)
 Addended by: CLEATUS LORELI RAMAN on: 09/12/2024 02:12 PM   Modules accepted: Orders

## 2024-09-14 DIAGNOSIS — S22060D Wedge compression fracture of T7-T8 vertebra, subsequent encounter for fracture with routine healing: Secondary | ICD-10-CM

## 2024-09-15 NOTE — Telephone Encounter (Signed)
 Received a fax from  [employer] regarding an approval for OHTUVAYRE  patient assistance to 09/08/2025. Approval letter sent to scan center.  Phone#: [phone] Fax#: [phone]   Rx will be forwarded to [facility]  Sherry Pennant, PharmD, MPH, BCPS, CPP Clinical Pharmacist

## 2024-09-15 NOTE — Telephone Encounter (Signed)
 Will forward new hydrocodone  refill request to PCP

## 2024-09-16 NOTE — Telephone Encounter (Signed)
 Sent. Thanks.  When is he seeing the pain clinic?

## 2024-09-17 ENCOUNTER — Other Ambulatory Visit: Payer: Self-pay

## 2024-09-20 ENCOUNTER — Other Ambulatory Visit: Payer: Self-pay | Admitting: Family Medicine

## 2024-09-20 DIAGNOSIS — S22060D Wedge compression fracture of T7-T8 vertebra, subsequent encounter for fracture with routine healing: Secondary | ICD-10-CM

## 2024-09-21 NOTE — Telephone Encounter (Signed)
 Spoke to pt. He said he is not opposed pain management, he just has not scheduled it yet.   Name of Medication: Hydrocodone  5-325 Name of Pharmacy: [facility] Last [facility] or Written Date and Quantity: 09-16-24 Last Office Visit and Type: 06-28-24 Next Office Visit and Type: No appt Last Controlled Substance Agreement Date: N/A Last UDS: N/A

## 2024-09-22 NOTE — Telephone Encounter (Signed)
 Please process the pain clinic referral.  He needs to be seen by them.  I am not comfortable with unending rx for hydrocodone  w/o pain clinic eval.  Thanks.

## 2024-09-23 NOTE — Telephone Encounter (Signed)
 Noted.  Thanks.   Per neurosurgery note, Recommend pain management for further medical management of pain. He will discuss with PCP and let me know if he needs a referral.

## 2024-09-23 NOTE — Telephone Encounter (Signed)
 Called patient gave him the phone number. He said that you talked about this with him before and you wanted him to go to neurosurgery. He states that at that time neurosurgery recommended that he continue on pain medications. Patient informed that provider is recommending pain clinic evaluation for further evaluation. He has filled for this refill but he is not comfortable with unending rx without pain clinic evaluation. Patient agreed to call office and get appointment set up.

## 2024-09-24 ENCOUNTER — Telehealth: Payer: Self-pay | Admitting: Family Medicine

## 2024-09-24 NOTE — Telephone Encounter (Signed)
 Copied from CRM [id number]. Topic: Referral - Status >> Sep 24, 2024 11:57 AM Macario HERO wrote: Reason for CRM: Patient called to let Clotilda know he's been calling pain management clinic and they are not answering, he left a message and is waiting for a response.

## 2024-09-26 NOTE — Telephone Encounter (Signed)
 Noted.  Thanks.  I appreciate his effort.

## 2024-09-28 ENCOUNTER — Ambulatory Visit: Admitting: Urology

## 2024-09-28 ENCOUNTER — Encounter: Payer: Self-pay | Admitting: Urology

## 2024-09-28 VITALS — BP 130/71 | HR 73 | Ht 68.0 in | Wt 158.0 lb

## 2024-09-28 DIAGNOSIS — Z09 Encounter for follow-up examination after completed treatment for conditions other than malignant neoplasm: Secondary | ICD-10-CM | POA: Diagnosis not present

## 2024-09-28 DIAGNOSIS — N210 Calculus in bladder: Secondary | ICD-10-CM

## 2024-09-28 LAB — URINALYSIS, COMPLETE
Bilirubin, UA: NEGATIVE
Glucose, UA: NEGATIVE
Ketones, UA: NEGATIVE
Leukocytes,UA: NEGATIVE
Nitrite, UA: NEGATIVE
Protein,UA: NEGATIVE
Specific Gravity, UA: 1.03 (ref 1.005–1.030)
Urobilinogen, Ur: 0.2 mg/dL (ref 0.2–1.0)
pH, UA: 5.5 (ref 5.0–7.5)

## 2024-09-28 LAB — MICROSCOPIC EXAMINATION: Bacteria, UA: NONE SEEN

## 2024-09-28 LAB — BLADDER SCAN AMB NON-IMAGING: Scan Result: 0

## 2024-09-28 NOTE — Progress Notes (Signed)
 "  09/28/2024 11:16 AM   Roger Lewis January 02, 1951 [id number]  Referring provider: Cleatus Arlyss RAMAN, MD [address] [place],  [place] [address]  Chief Complaint  Patient presents with   Follow-up   Urologic history:  1.  Bladder calculus Cystolitholapaxy 08/26/24-15 mm bladder calculus with significant irritative voiding symptoms No significant BPH  HPI: Roger Lewis is a 74 y.o. male presents for postop follow-up.  No postoperative problems Fairly significant irritative voiding symptoms for 2 days postop with progressive improvement His preoperative symptoms have resolved Has noted dull pelvic discomfort for the last 2 days   PMH: Past Medical History:  Diagnosis Date   COPD (chronic obstructive pulmonary disease) (HCC)    GERD (gastroesophageal reflux disease)    Renal stones     Surgical History: Past Surgical History:  Procedure Laterality Date   CYSTOSCOPY WITH LITHOLAPAXY N/A 08/26/2024   Procedure: CYSTOSCOPY, WITH BLADDER CALCULUS LITHOLAPAXY;  Surgeon: Twylla Glendia BROCKS, MD;  Location: [facility] ORS;  Service: Urology;  Laterality: N/A;  less than 2.5   HAND SURGERY Right 1997    Home Medications:  Allergies as of 09/28/2024       Reactions   Penicillins Rash   Flexeril  [cyclobenzaprine ] Other (See Comments)   Sedation  Pt reports he does not believe this is an allergy.        Medication List        Accurate as of September 28, 2024 11:16 AM. If you have any questions, ask your nurse or doctor.          STOP taking these medications    famotidine  20 MG tablet Commonly known as: PEPCID  Stopped by: Glendia Twylla, MD       TAKE these medications    acetaminophen  325 MG tablet Commonly known as: TYLENOL  Take 325 mg by mouth 3 (three) times daily as needed (pain.).   albuterol  108 (90 Base) MCG/ACT inhaler Commonly known as: VENTOLIN  HFA USE 1 TO 2 INHALATIONS BY MOUTH  EVERY 6 HOURS AS NEEDED FOR  SHORTNESS OF BREATH OR WHEEZING    Breztri  Aerosphere 160-9-4.8 MCG/ACT Aero inhaler Generic drug: budesonide-glycopyrrolate -formoterol Inhale 2 puffs into the lungs in the morning and at bedtime.   cholecalciferol 25 MCG (1000 UNIT) tablet Commonly known as: VITAMIN D3 Take 1,000 Units by mouth 4 (four) times a week.   fluticasone  50 MCG/ACT nasal spray Commonly known as: FLONASE  Place 2 sprays into both nostrils daily as needed for allergies or rhinitis.   gabapentin  100 MG capsule Commonly known as: NEURONTIN  Take 1-3 capsules (100-300 mg total) by mouth 2 (two) times daily.   HYDROcodone -acetaminophen  5-325 MG tablet Commonly known as: NORCO/VICODIN Take 1 tablet by mouth 3 (three) times daily as needed for moderate pain (pain score 4-6).   ibuprofen  200 MG tablet Commonly known as: ADVIL  Take 200 mg by mouth 3 (three) times daily as needed (with food for pain).   multivitamin with minerals Tabs tablet Take 1 tablet by mouth 4 (four) times a week.   Ohtuvayre  3 MG/2.5ML Susp Generic drug: Ensifentrine  Inhale 3 mg into the lungs in the morning and at bedtime.   OVER THE COUNTER MEDICATION Take 15 drops by mouth daily. Chanca Piedra Stone Breaker Liquid   phenazopyridine  200 MG tablet Commonly known as: PYRIDIUM  Take 1 tablet (200 mg total) by mouth 3 (three) times daily as needed (burning with urination).   ROLAIDS PO Take 1-2 tablets by mouth at bedtime as needed (indigestion/heartburn.).  tamsulosin  0.4 MG Caps capsule Commonly known as: FLOMAX  Take 1 capsule (0.4 mg total) by mouth daily as needed.   tiZANidine  4 MG tablet Commonly known as: Zanaflex  Take 1/2 tablet at bedtime prn muscle spasm   triamcinolone  cream 0.1 % Commonly known as: KENALOG  Apply 1 Application topically 2 (two) times daily as needed (skin irritation.).        Allergies: Allergies[1]  Family History: Family History  Problem Relation Age of Onset   COPD Mother    COPD Father    Prostate cancer Paternal  Uncle    Coronary artery disease Brother 19       multiple stents   Colon cancer Neg Hx     Social History:  reports that he quit smoking about 16 years ago. His smoking use included cigarettes. He started smoking about 56 years ago. He has a 40 pack-year smoking history. He has never used smokeless tobacco. He reports that he does not drink alcohol and does not use drugs.   Physical Exam: BP (!) 141/78   Pulse 73   Ht 5' 8 (1.727 m)   Wt 158 lb (71.7 kg)   BMI 24.02 kg/m   Constitutional:  Alert, No acute distress. HEENT: [place] AT Respiratory: Normal respiratory effort, no increased work of breathing. Psychiatric: Normal mood and affect.  Laboratory Data:  Urinalysis Dipstick 1+ blood Microscopy 3-10 RBC    Assessment & Plan:   Doing well status post cystolitholapaxy with symptom resolution UA today with mild microhematuria PVR 0 mL 74-month follow-up with [facility]; call earlier for worsening pelvic pain   Glendia JAYSON Barba, MD  [facility] [address], [address] [place], [place] [address] [phone]     [1]  Allergies Allergen Reactions   Penicillins Rash   Flexeril  [Cyclobenzaprine ] Other (See Comments)    Sedation  Pt reports he does not believe this is an allergy.   "

## 2024-09-28 NOTE — Progress Notes (Signed)
 Patient presents for an office visit. BP today is High. Greater than 140/90. Provider  notified and recheck Blood Pressure is back to normal   at 130/71 .

## 2024-09-30 ENCOUNTER — Emergency Department (HOSPITAL_BASED_OUTPATIENT_CLINIC_OR_DEPARTMENT_OTHER)
Admission: EM | Admit: 2024-09-30 | Discharge: 2024-09-30 | Disposition: A | Discharge status: Home / Self Care | Attending: Emergency Medicine | Admitting: Emergency Medicine

## 2024-09-30 ENCOUNTER — Other Ambulatory Visit: Payer: Self-pay

## 2024-09-30 DIAGNOSIS — X500XXA Overexertion from strenuous movement or load, initial encounter: Secondary | ICD-10-CM | POA: Diagnosis not present

## 2024-09-30 DIAGNOSIS — Z79899 Other long term (current) drug therapy: Secondary | ICD-10-CM | POA: Insufficient documentation

## 2024-09-30 DIAGNOSIS — G8929 Other chronic pain: Secondary | ICD-10-CM | POA: Insufficient documentation

## 2024-09-30 DIAGNOSIS — S39012A Strain of muscle, fascia and tendon of lower back, initial encounter: Secondary | ICD-10-CM | POA: Diagnosis not present

## 2024-09-30 DIAGNOSIS — S3992XA Unspecified injury of lower back, initial encounter: Secondary | ICD-10-CM | POA: Diagnosis present

## 2024-09-30 MED ORDER — HYDROMORPHONE HCL 1 MG/ML IJ SOLN
1.0000 mg | Freq: Once | INTRAMUSCULAR | Status: AC
  Administered 2024-09-30: 1 mg via INTRAMUSCULAR
  Filled 2024-09-30: qty 1

## 2024-09-30 MED ORDER — ACETAMINOPHEN 500 MG PO TABS
1000.0000 mg | ORAL_TABLET | Freq: Once | ORAL | Status: AC
  Administered 2024-09-30: 1000 mg via ORAL
  Filled 2024-09-30: qty 2

## 2024-09-30 MED ORDER — LIDOCAINE 5 % EX PTCH
1.0000 | MEDICATED_PATCH | CUTANEOUS | Status: DC
  Administered 2024-09-30: 1 via TRANSDERMAL
  Filled 2024-09-30: qty 1

## 2024-09-30 MED ORDER — KETOROLAC TROMETHAMINE 30 MG/ML IJ SOLN
30.0000 mg | Freq: Once | INTRAMUSCULAR | Status: AC
  Administered 2024-09-30: 30 mg via INTRAMUSCULAR
  Filled 2024-09-30: qty 1

## 2024-09-30 MED ORDER — METHYLPREDNISOLONE 4 MG PO TBPK: ORAL_TABLET | ORAL | 0 refills | Status: DC

## 2024-09-30 NOTE — Discharge Instructions (Signed)
 1.  You have been given a shot for pain in the emergency department. 2.  Go to the pharmacy and fill your prescription for Solu-Medrol .  This is a steroid to help with inflammation.  You may start it today and spread out the day 1 dosing over today and tomorrow morning.  Catch up all of the second day tomorrow by evening and then take the third day as prescribed. 3.  You may apply a pain patch and/or ice over the area of pain in your lower back. 4.  Most likely you have a strain or possibly a partial disc herniation.  You might have caused another compression fracture although by your description it seems more likely to have strained it.  Call your doctor tomorrow morning to discuss ongoing pain control and temporarily increasing your regularly prescribed pain medications if needed for an acute injury. 5.  Return to the emergency department if you have weakness or numbness in your legs, difficulty emptying your bladder, loss of sensation around your genital area or other concerning changes.

## 2024-09-30 NOTE — ED Triage Notes (Signed)
 Patient states right sided lower back pain since picking up a generator earlier today.

## 2024-09-30 NOTE — ED Notes (Addendum)
 Reviewed discharge instructions, medications, and home care with pt and family. Pt verbalized understanding and had no further questions. Pt exited ED without complications.

## 2024-09-30 NOTE — ED Provider Notes (Signed)
 " [place] EMERGENCY DEPARTMENT AT [facility] Provider Note   CSN: [id number] Arrival date & time: 09/30/24  1416     Patient presents with: Back Pain   Roger Lewis is a 74 y.o. male.   HPI Patient has chronic thoracic back pain from prior compression fracture.  Patient reports this morning he was helping lift a generator and got a pain in his lower back.  He indicates about L4-5.  He reports it felt like it pulled his spine.  Patient reports that he took his morning Vicodin dose but did not take an additional dose for this injury.  He reports it seems like the pain medication is worn off and now from the injury he is having severe focal pain in his lower back.  Patient specifically indicates midline spine.  Patient denies radiation of pain.  He denies any weakness or numbness to his legs but reports it hurts a lot when he standing up.  No abdominal pain.  No loss of control of bowel or bladder.    Prior to Admission medications  Medication Sig Start Date End Date Taking? Authorizing Provider  methylPREDNISolone  (MEDROL  DOSEPAK) 4 MG TBPK tablet Per dose pack 09/30/24  Yes Jimeka Balan, Ludivina, MD  acetaminophen  (TYLENOL ) 325 MG tablet Take 325 mg by mouth 3 (three) times daily as needed (pain.).    [provider]  albuterol  (VENTOLIN  HFA) 108 (90 Base) MCG/ACT inhaler USE 1 TO 2 INHALATIONS BY MOUTH  EVERY 6 HOURS AS NEEDED FOR  SHORTNESS OF BREATH OR WHEEZING 07/22/24   Cleatus Arlyss RAMAN, MD  Budeson-Glycopyrrol-Formoterol (BREZTRI  AEROSPHERE) 160-9-4.8 MCG/ACT AERO Inhale 2 puffs into the lungs in the morning and at bedtime. 07/14/23   Assaker, Darrin, MD  cholecalciferol (VITAMIN D3) 25 MCG (1000 UNIT) tablet Take 1,000 Units by mouth 4 (four) times a week.    [provider]  Dihydroxyaluminum Sod Carb (ROLAIDS PO) Take 1-2 tablets by mouth at bedtime as needed (indigestion/heartburn.).    [provider]  Ensifentrine  (OHTUVAYRE ) 3 MG/2.5ML  SUSP Inhale 3 mg into the lungs in the morning and at bedtime.    [provider]  fluticasone  (FLONASE ) 50 MCG/ACT nasal spray Place 2 sprays into both nostrils daily as needed for allergies or rhinitis. 10/09/22   Cleatus Arlyss RAMAN, MD  gabapentin  (NEURONTIN ) 100 MG capsule Take 1-3 capsules (100-300 mg total) by mouth 2 (two) times daily. 07/04/24   Cleatus Arlyss RAMAN, MD  HYDROcodone -acetaminophen  (NORCO/VICODIN) 5-325 MG tablet Take 1 tablet by mouth 3 (three) times daily as needed for moderate pain (pain score 4-6). 09/22/24   Cleatus Arlyss RAMAN, MD  ibuprofen  (ADVIL ) 200 MG tablet Take 200 mg by mouth 3 (three) times daily as needed (with food for pain).    [provider]  Multiple Vitamin (MULTIVITAMIN WITH MINERALS) TABS tablet Take 1 tablet by mouth 4 (four) times a week.    [provider]  OVER THE COUNTER MEDICATION Take 15 drops by mouth daily. Chanca Piedra Stone Breaker Liquid    [provider]  phenazopyridine  (PYRIDIUM ) 200 MG tablet Take 1 tablet (200 mg total) by mouth 3 (three) times daily as needed (burning with urination). 08/26/24   Stoioff, Glendia BROCKS, MD  tamsulosin  (FLOMAX ) 0.4 MG CAPS capsule Take 1 capsule (0.4 mg total) by mouth daily as needed. 05/20/24   Cleatus Arlyss RAMAN, MD  tiZANidine  (ZANAFLEX ) 4 MG tablet Take 1/2 tablet at bedtime prn muscle spasm 05/20/24   Cleatus Arlyss RAMAN,  MD  triamcinolone  cream (KENALOG ) 0.1 % Apply 1 Application topically 2 (two) times daily as needed (skin irritation.).    [provider]    Allergies: Penicillins and Flexeril  [cyclobenzaprine ]    Review of Systems  Updated Vital Signs BP 137/80   Pulse 75   Temp (!) 97.2 F (36.2 C) (Temporal)   Resp 16   SpO2 94%   Physical Exam Constitutional:      Comments: Patient is alert.  Mental status clear.  No respiratory distress.  HENT:     Mouth/Throat:     Pharynx: Oropharynx is clear.  Eyes:     Extraocular Movements: Extraocular movements  intact.  Cardiovascular:     Rate and Rhythm: Normal rate and regular rhythm.  Pulmonary:     Effort: Pulmonary effort is normal.     Breath sounds: Normal breath sounds.  Abdominal:     General: There is no distension.     Palpations: Abdomen is soft.     Tenderness: There is no abdominal tenderness. There is no guarding.  Musculoskeletal:        General: Normal range of motion.     Comments: Patient has focal pain in his lumbar spine at approximately L3-4 or L4-5.  Patient indicates this by pointing.  He reports pain localizes here when he goes from supine to sitting to standing.  Patient does not have significantly focal bony point tenderness to palpation.  No rash or soft tissue abnormality.  From sitting position on stretcher patient can transition to sitting at the edge of the stretcher in standing position albeit painfully so.  Patient symmetrically bears weight and ambulates with antalgic gait but reports no weakness or numbness to the legs.  Skin:    General: Skin is warm and dry.  Neurological:     General: No focal deficit present.     Mental Status: He is oriented to person, place, and time.     (all labs ordered are listed, but only abnormal results are displayed) Labs Reviewed - No data to display  EKG: None  Radiology: No results found.   Procedures   Medications Ordered in the ED  HYDROmorphone  (DILAUDID ) injection 1 mg (has no administration in time range)  ketorolac  (TORADOL ) 30 MG/ML injection 30 mg (has no administration in time range)  acetaminophen  (TYLENOL ) tablet 1,000 mg (has no administration in time range)  lidocaine  (LIDODERM ) 5 % 1 patch (has no administration in time range)                                    Medical Decision Making Risk OTC drugs. Prescription drug management.   Patient presents as outlined.  He had an acute back strain this morning while lifting a heavy object.  Patient already has chronic pain from a compression  fracture in the thoracic spine.  This time there is no indication of neuro logic compromise.  Patient does not have lower extremity weakness.  At this time I do not think additional imaging is emergently indicated.  Differential includes partial disc herniation but without critical neurologic compression\repeat compression fracture\muscular strain.  Given severe acute pain, will treat the patient Emergency Department with 1 IM dose of Dilaudid  and Toradol  and oral dose of acetaminophen .  I discussed the plan with the patient of starting a Medrol  Dosepak.  Patient will continue hydrocodone  at home.  He is counseled to discuss with his  prescriber increasing prescription temporarily if needed for an acute pain episode.  We also discussed local care with Lidoderm  patch and icing.  Patient voices understanding.     Final diagnoses:  Back strain, initial encounter    ED Discharge Orders          Ordered    methylPREDNISolone  (MEDROL  DOSEPAK) 4 MG TBPK tablet        09/30/24 1535               Armenta Canning, MD 09/30/24 1545  "

## 2024-10-01 ENCOUNTER — Other Ambulatory Visit: Payer: Self-pay | Admitting: Family Medicine

## 2024-10-01 DIAGNOSIS — S22060D Wedge compression fracture of T7-T8 vertebra, subsequent encounter for fracture with routine healing: Secondary | ICD-10-CM

## 2024-10-04 NOTE — Telephone Encounter (Addendum)
 SABRA

## 2024-10-04 NOTE — Telephone Encounter (Signed)
 Requesting: Norco  Contract: No UDS:  Last Visit: 06/28/2024 Next Visit: 10/07/2024 Last Refill: 09/22/2024  Please Advise

## 2024-10-05 NOTE — Telephone Encounter (Signed)
 Last Rx hydrocodone  #30 TID PRN on 09/22/2024 New Rx sent in for #15. Will forward to PCP as fyi.  He is seeing PCP later this week.

## 2024-10-06 NOTE — Telephone Encounter (Signed)
 Noted. Thanks.

## 2024-10-07 ENCOUNTER — Encounter: Payer: Self-pay | Admitting: Family Medicine

## 2024-10-07 ENCOUNTER — Ambulatory Visit: Admitting: Family Medicine

## 2024-10-07 VITALS — BP 122/64 | HR 72 | Temp 97.6°F | Ht 68.0 in | Wt 162.1 lb

## 2024-10-07 DIAGNOSIS — M545 Low back pain, unspecified: Secondary | ICD-10-CM

## 2024-10-07 DIAGNOSIS — S22060D Wedge compression fracture of T7-T8 vertebra, subsequent encounter for fracture with routine healing: Secondary | ICD-10-CM | POA: Diagnosis not present

## 2024-10-07 MED ORDER — HYDROCODONE-ACETAMINOPHEN 5-325 MG PO TABS: 1.0000 | ORAL_TABLET | Freq: Three times a day (TID) | ORAL | 0 refills | Status: AC | PRN

## 2024-10-07 NOTE — Patient Instructions (Addendum)
 Start gabapentin  in the meantime.  Use hydrocodone  if needed and let me know if you don't hear from the pain clinic soon.  Take care.  Glad to see you.
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
## 2024-10-10 NOTE — Assessment & Plan Note (Signed)
 With flare of chronic back pain after trying to lift a generator. Cautions d/w pt.  Continue using a back brace.  Start gabapentin  in the meantime.  Use hydrocodone  if needed and he can let me know if he doesn't hear from the pain clinic soon.

## 2025-03-28 ENCOUNTER — Ambulatory Visit: Admitting: Urology

## 2025-06-14 ENCOUNTER — Ambulatory Visit
